# Patient Record
Sex: Female | Born: 1955 | Race: White | Hispanic: No | Marital: Single | Smoking: Current every day smoker
Health system: Southern US, Community
[De-identification: ages and names within clinical notes are randomized; demographics above are authoritative.]

## PROBLEM LIST (undated history)

## (undated) DIAGNOSIS — F419 Anxiety disorder, unspecified: Secondary | ICD-10-CM

## (undated) DIAGNOSIS — K219 Gastro-esophageal reflux disease without esophagitis: Secondary | ICD-10-CM

## (undated) DIAGNOSIS — H33309 Unspecified retinal break, unspecified eye: Secondary | ICD-10-CM

## (undated) DIAGNOSIS — M810 Age-related osteoporosis without current pathological fracture: Secondary | ICD-10-CM

## (undated) DIAGNOSIS — H269 Unspecified cataract: Secondary | ICD-10-CM

## (undated) DIAGNOSIS — M1990 Unspecified osteoarthritis, unspecified site: Secondary | ICD-10-CM

## (undated) DIAGNOSIS — G43909 Migraine, unspecified, not intractable, without status migrainosus: Secondary | ICD-10-CM

## (undated) DIAGNOSIS — N83209 Unspecified ovarian cyst, unspecified side: Secondary | ICD-10-CM

## (undated) HISTORY — PX: EYE SURGERY: SHX253

## (undated) HISTORY — DX: Unspecified cataract: H26.9

## (undated) HISTORY — DX: Migraine, unspecified, not intractable, without status migrainosus: G43.909

## (undated) HISTORY — DX: Unspecified osteoarthritis, unspecified site: M19.90

## (undated) HISTORY — DX: Gastro-esophageal reflux disease without esophagitis: K21.9

## (undated) HISTORY — DX: Unspecified ovarian cyst, unspecified side: N83.209

## (undated) HISTORY — DX: Age-related osteoporosis without current pathological fracture: M81.0

## (undated) HISTORY — DX: Unspecified retinal break, unspecified eye: H33.309

## (undated) HISTORY — DX: Anxiety disorder, unspecified: F41.9

---

## 1997-11-18 ENCOUNTER — Ambulatory Visit (HOSPITAL_COMMUNITY): Admission: RE | Admit: 1997-11-18 | Discharge: 1997-11-18 | Payer: Self-pay | Admitting: Ophthalmology

## 1998-08-05 ENCOUNTER — Other Ambulatory Visit: Admission: RE | Admit: 1998-08-05 | Discharge: 1998-08-05 | Payer: Self-pay | Admitting: Obstetrics and Gynecology

## 1998-08-25 ENCOUNTER — Other Ambulatory Visit: Admission: RE | Admit: 1998-08-25 | Discharge: 1998-08-25 | Payer: Self-pay | Admitting: Obstetrics and Gynecology

## 1999-10-03 ENCOUNTER — Other Ambulatory Visit: Admission: RE | Admit: 1999-10-03 | Discharge: 1999-10-03 | Payer: Self-pay | Admitting: Obstetrics and Gynecology

## 2000-10-19 ENCOUNTER — Other Ambulatory Visit: Admission: RE | Admit: 2000-10-19 | Discharge: 2000-10-19 | Payer: Self-pay | Admitting: Obstetrics and Gynecology

## 2005-09-26 ENCOUNTER — Other Ambulatory Visit: Admission: RE | Admit: 2005-09-26 | Discharge: 2005-09-26 | Payer: Self-pay | Admitting: Obstetrics and Gynecology

## 2007-10-10 ENCOUNTER — Encounter: Admission: RE | Admit: 2007-10-10 | Discharge: 2007-10-10 | Payer: Self-pay | Admitting: Obstetrics and Gynecology

## 2010-06-26 ENCOUNTER — Encounter: Payer: Self-pay | Admitting: Obstetrics and Gynecology

## 2013-12-19 LAB — HM MAMMOGRAPHY

## 2014-03-02 ENCOUNTER — Ambulatory Visit: Payer: 59

## 2014-06-22 ENCOUNTER — Ambulatory Visit
Admission: RE | Admit: 2014-06-22 | Discharge: 2014-06-22 | Disposition: A | Discharge status: Home / Self Care | Payer: 59 | Source: Ambulatory Visit | Attending: Emergency Medicine | Admitting: Emergency Medicine

## 2014-06-22 ENCOUNTER — Ambulatory Visit (INDEPENDENT_AMBULATORY_CARE_PROVIDER_SITE_OTHER): Payer: 59 | Admitting: Emergency Medicine

## 2014-06-22 ENCOUNTER — Ambulatory Visit (INDEPENDENT_AMBULATORY_CARE_PROVIDER_SITE_OTHER): Payer: 59

## 2014-06-22 VITALS — BP 106/66 | HR 70 | Temp 98.4°F | Resp 18 | Ht <= 58 in | Wt 97.0 lb

## 2014-06-22 DIAGNOSIS — S0093XA Contusion of unspecified part of head, initial encounter: Secondary | ICD-10-CM

## 2014-06-22 DIAGNOSIS — M25511 Pain in right shoulder: Secondary | ICD-10-CM

## 2014-06-22 NOTE — Patient Instructions (Signed)
Please report to [place] today at 2:10pm for your CT Scan The address is [place]

## 2014-06-22 NOTE — Progress Notes (Addendum)
   Subjective:    Patient ID: Christina Hinton, female    DOB: [birth year], 59 y.o.   MRN: [id number] This chart was scribed for Arlyss Queen, MD by Marti Sleigh, Medical Scribe. This patient was seen in Room 13 and the patient's care was started at 11:43 AM.  Chief Complaint  Patient presents with  . Fall    last night   . Shoulder Pain    rt  . Arm Pain    rt  . Headache    HPI HPI Comments: Christina Hinton is a 59 y.o. female who presents to [facility] complaining of right sided shoulder and arm pain, as well as HA after a fall that happened yesterday at 4pm. Pt states she got cought up in the vacuum cord, and fell backwards and hit her shoulder, arm and head on the door and stairs. Pt states she was backing up the stairs carrying her vacuum. Pt states it was several minutes before she could get up. Pt is unsure if she passed out. Pt endorses associated improving swelling on the back of her head. Pt denies nausea, vomiting. Pt states she hurts all over. Pt states he drove herself to [facility].    Review of Systems  Constitutional: Negative for fever and chills.  Gastrointestinal: Negative for nausea and vomiting.  Musculoskeletal: Positive for myalgias and back pain.  Skin: Positive for color change. Negative for wound.  Neurological: Positive for headaches. Negative for dizziness, syncope and weakness.       Objective:   Physical Exam  Constitutional: She is oriented to person, place, and time. She appears well-developed and well-nourished.  Alert, cooperative, nontoxic.  HENT:  Head: Normocephalic and atraumatic.  Significant tenderness over the occipital area.  Eyes: Pupils are equal, round, and reactive to light.  Neck: Neck supple.  Cardiovascular: Normal rate and regular rhythm.   Pulmonary/Chest: Effort normal and breath sounds normal. No respiratory distress.  Musculoskeletal:  Patient has crepitation with internal/external rotation of the right shoulder. There is no definite  weakness elicited.  Neurological: She is alert and oriented to person, place, and time.  Cranial nerves 2-12 intact. Motor strength 5/5. Reflexes 2+ and symmetrical.   Skin: Skin is warm and dry.  Psychiatric: She has a normal mood and affect. Her behavior is normal.  Nursing note and vitals reviewed. [facility] reading (PRIMARY) by  Dr.Daub No fracture      Assessment & Plan:    I suspect the shoulder injury is a contusion. Will proceed with CT head because of the contusion to the occipital area. She will be given a sling for shoulder comfort, and she can take advil or aleve for the sholdler discomfort.I personally performed the services described in this documentation, which was scribed in my presence. The recorded information has been reviewed and is accurate. CT of head was call report and negative

## 2014-06-25 ENCOUNTER — Telehealth: Payer: Self-pay

## 2014-06-25 NOTE — Telephone Encounter (Signed)
Patient came in and saw Dr. Everlene Farrier on 06/22/2014 with head and shoulder pain from a fall.   Patient was wanting Dr. Everlene Farrier to call in some pain medication, since the pain is not getting better.   She has been taking over the counter pain reliever for the pain.  Pt # [id number]

## 2014-06-26 NOTE — Telephone Encounter (Signed)
OK to call in ultram # 20 every 6 hours.

## 2014-06-28 MED ORDER — TRAMADOL HCL 50 MG PO TABS: 50.0000 mg | ORAL_TABLET | Freq: Four times a day (QID) | ORAL | Status: DC | PRN

## 2014-06-28 NOTE — Telephone Encounter (Signed)
Called in script. LM to advise pt.

## 2014-08-10 ENCOUNTER — Ambulatory Visit (INDEPENDENT_AMBULATORY_CARE_PROVIDER_SITE_OTHER): Payer: 59 | Admitting: Family Medicine

## 2014-08-10 VITALS — BP 110/74 | HR 72 | Temp 98.0°F | Resp 16 | Ht <= 58 in | Wt 97.8 lb

## 2014-08-10 DIAGNOSIS — H269 Unspecified cataract: Secondary | ICD-10-CM | POA: Diagnosis not present

## 2014-08-10 DIAGNOSIS — G43109 Migraine with aura, not intractable, without status migrainosus: Secondary | ICD-10-CM | POA: Diagnosis not present

## 2014-08-10 DIAGNOSIS — F411 Generalized anxiety disorder: Secondary | ICD-10-CM

## 2014-08-10 MED ORDER — BUTALBITAL-ASPIRIN-CAFFEINE 50-325-40 MG PO CAPS: 1.0000 | ORAL_CAPSULE | Freq: Four times a day (QID) | ORAL | Status: DC | PRN

## 2014-08-10 MED ORDER — ALPRAZOLAM 0.25 MG PO TABS: 0.2500 mg | ORAL_TABLET | Freq: Every day | ORAL | Status: DC | PRN

## 2014-08-10 NOTE — Progress Notes (Signed)
Chief Complaint:  Chief Complaint  Patient presents with  . Headache    6 weeks- the pain goes down the back of her neck  . Hip Pain    right hip- 3 years  . Head Injury    in January- She had a CT scan done- she wants a recheck on the head    HPI: Christina Hinton is a 59 y.o. female who is here for establishment of care with a new PCP. PCP is actually Dr. Everlene Farrier. However since she has not been in the office for 3 years she needs to establish care with a new PCP and Dr. Everlene Farrier is not accepting new patients. 1. She had a head injury with a negative CT scan in January. Continues to have headaches but they are better. They were more consistent with her migraine headaches. She was prescribed tramadol by Dr. Everlene Farrier for headache pain. She states that she did not even fill the medication since she does not want take tramadol and have seizures. She knew of a patient who had seizures and was taking tramadol. She has no new symptoms associated with her migraine headaches. She has some light and noise sensitivity. 2. She has had difficulty sleeping. She usually takes Xanax which also helps her with her sleep. Primarily for her anxiety. She usually takes it only once and at most twice a day to help with her anxiety and sometimes insomnia. 3. She has osteoporosis. She has arthritis. 4. She has a history of cataracts with retinal inflammation on for ketorolac drops. She has not been sent ophthalmologist in denies any major vision changes, black spots or blindness. The eye pain.  She is primarily here to establish care. She has no new problems. She has a scheduled annual visit soon and was told that she needs to establish care in order to have a scheduled annual in 104.  Past Medical History  Diagnosis Date  . Anxiety   . Arthritis   . Cataract   . Osteoporosis    Past Surgical History  Procedure Laterality Date  . Cesarean section    . Eye surgery     History   Social History  . Marital  Status: Single    Spouse Name: N/A  . Number of Children: N/A  . Years of Education: N/A   Social History Main Topics  . Smoking status: Current Every Day Smoker -- 1.00 packs/day for 30 years    Types: Cigarettes  . Smokeless tobacco: Not on file  . Alcohol Use: No  . Drug Use: No  . Sexual Activity: Not on file   Other Topics Concern  . None   Social History Narrative   Family History  Problem Relation Age of Onset  . Hyperlipidemia Mother   . Stroke Maternal Grandmother   . Cancer Maternal Grandfather   . Diabetes Paternal Grandmother    No Known Allergies Prior to Admission medications   Medication Sig Start Date End Date Taking? Authorizing Provider  ketorolac (ACULAR) 0.5 % ophthalmic solution Place 1 drop into the right eye 2 (two) times daily.   Yes Historical Provider, MD  Multiple Vitamin (MULTIVITAMIN) capsule Take 1 capsule by mouth daily.   Yes Historical Provider, MD  PARoxetine (PAXIL) 10 MG tablet Take 10 mg by mouth daily.   Yes Historical Provider, MD  ranitidine (ZANTAC) 150 MG capsule Take 150 mg by mouth 2 (two) times daily.   Yes Historical Provider, MD  traMADol Veatrice Bourbon)  50 MG tablet Take 1 tablet (50 mg total) by mouth every 6 (six) hours as needed. Patient not taking: Reported on 08/10/2014 06/28/14   [name], MD     ROS: The patient denies fevers, chills, night sweats, unintentional weight loss, chest pain, palpitations, wheezing, dyspnea on exertion, nausea, vomiting, abdominal pain, dysuria, hematuria, melena, numbness, weakness, or tingling.   All other systems have been reviewed and were otherwise negative with the exception of those mentioned in the HPI and as above.    PHYSICAL EXAM: Filed Vitals:   08/10/14 1203  BP: 110/74  Pulse: 72  Temp: 98 F (36.7 C)  Resp: 16   Filed Vitals:   08/10/14 1203  Height: 4\' 10"  (1.473 m)  Weight: 97 lb 12.8 oz (44.362 kg)   Body mass index is 20.45 kg/(m^2).  General: Alert, no acute  distress HEENT:  Normocephalic, atraumatic, oropharynx patent. EOMI, PERRLA, TMs bilaterally normal. No exudates. fundo grossly nl, no light sensitivity Cardiovascular:  Regular rate and rhythm, no rubs murmurs or gallops.  No Carotid bruits, radial pulse intact. No pedal edema.  Respiratory: Clear to auscultation bilaterally.  No wheezes, rales, or rhonchi.  No cyanosis, no use of accessory musculature GI: No organomegaly, abdomen is soft and non-tender, positive bowel sounds.  No masses. Skin: No rashes. Neurologic: Facial musculature symmetric. Psychiatric: Patient is appropriate throughout our interaction. Lymphatic: No cervical lymphadenopathy Musculoskeletal: Gait intact. 5 out of 5 strength.   LABS: No results found for this or any previous visit.   EKG/XRAY:   Primary read interpreted by Dr. Marin Comment at [facility].   ASSESSMENT/PLAN: Encounter Diagnoses  Name Primary?  . Migraine with aura and without status migrainosus, not intractable Yes  . Generalized anxiety disorder   . Cataract    She was prescribed Fioricet for migraine headaches to take when necessary. He talked about different causes of migraine headaches and also treatment options. She states that she has tried a lot of them and it good for me to look at her records. Worse that works for her very well. She was prescribed Xanax for her generalized anxiety disorder. She was referred to ophthalmology for history of bilateral cataracts and retinal abnormalities. She will call us with the name of the ophthalmology group she used to go to. We will make a referral based on that once she finds out a car. Follow-up for annual visit. Paper chart requested for Review.  Gross sideeffects, risk and benefits, and alternatives of medications d/w patient. Patient is aware that all medications have potential sideeffects and we are unable to predict every sideeffect or drug-drug interaction that may occur.  [place] Silveria, [place], DO 08/11/2014 3:18  PM

## 2014-08-11 ENCOUNTER — Telehealth: Payer: Self-pay

## 2014-08-11 NOTE — Telephone Encounter (Signed)
Patient is requesting to speak with Dr Marin Comment in regards to her being her PCP. Patient saw her yesterday for the first time at the walk in clinic. She is currently scheduled to see for her CPE on 08/21/14. Patients call back number is [phone]

## 2014-08-11 NOTE — Telephone Encounter (Signed)
Can you do me a favor and get her paper chart and out it in my box. I also need a print out of all her narcotics from the last one year, continue advancement of the PAs to do that for me. Thanks!

## 2014-08-11 NOTE — Telephone Encounter (Signed)
I called Christina Hinton, she wanted to clarify if Dr. Marin Comment will be her primary care physician. Please advise.

## 2014-08-14 ENCOUNTER — Ambulatory Visit: Payer: 59 | Admitting: Family Medicine

## 2014-08-20 NOTE — Telephone Encounter (Signed)
Chart is still in Dr. Gus Puma box. Stanton Kidney will take the chart and place it with the appt charts since the patient has an appt with Dr. Marin Comment tomorrow.

## 2014-08-21 ENCOUNTER — Encounter: Payer: Self-pay | Admitting: Family Medicine

## 2014-08-21 ENCOUNTER — Ambulatory Visit (INDEPENDENT_AMBULATORY_CARE_PROVIDER_SITE_OTHER): Payer: 59 | Admitting: Family Medicine

## 2014-08-21 VITALS — BP 102/63 | HR 65 | Temp 97.6°F | Resp 16 | Ht <= 58 in | Wt 98.4 lb

## 2014-08-21 DIAGNOSIS — Z0000 Encounter for general adult medical examination without abnormal findings: Secondary | ICD-10-CM

## 2014-08-21 DIAGNOSIS — Z2821 Immunization not carried out because of patient refusal: Secondary | ICD-10-CM | POA: Diagnosis not present

## 2014-08-21 DIAGNOSIS — Z1211 Encounter for screening for malignant neoplasm of colon: Secondary | ICD-10-CM | POA: Diagnosis not present

## 2014-08-21 DIAGNOSIS — Z1329 Encounter for screening for other suspected endocrine disorder: Secondary | ICD-10-CM

## 2014-08-21 DIAGNOSIS — Z130 Encounter for screening for diseases of the blood and blood-forming organs and certain disorders involving the immune mechanism: Secondary | ICD-10-CM

## 2014-08-21 DIAGNOSIS — Z13220 Encounter for screening for lipoid disorders: Secondary | ICD-10-CM

## 2014-08-21 DIAGNOSIS — H269 Unspecified cataract: Secondary | ICD-10-CM | POA: Diagnosis not present

## 2014-08-21 DIAGNOSIS — G43109 Migraine with aura, not intractable, without status migrainosus: Secondary | ICD-10-CM | POA: Insufficient documentation

## 2014-08-21 DIAGNOSIS — F411 Generalized anxiety disorder: Secondary | ICD-10-CM | POA: Diagnosis not present

## 2014-08-21 DIAGNOSIS — G4489 Other headache syndrome: Secondary | ICD-10-CM

## 2014-08-21 DIAGNOSIS — Z23 Encounter for immunization: Secondary | ICD-10-CM | POA: Diagnosis not present

## 2014-08-21 DIAGNOSIS — Z9889 Other specified postprocedural states: Secondary | ICD-10-CM | POA: Diagnosis not present

## 2014-08-21 LAB — COMPLETE METABOLIC PANEL WITH GFR
AST: 25 U/L (ref 0–37)
Albumin: 4.2 g/dL (ref 3.5–5.2)
Alkaline Phosphatase: 93 U/L (ref 39–117)
BUN: 17 mg/dL (ref 6–23)
CO2: 25 mEq/L (ref 19–32)
Calcium: 10.1 mg/dL (ref 8.4–10.5)
Chloride: 103 mEq/L (ref 96–112)
Creat: 1.04 mg/dL (ref 0.50–1.10)
GFR, Est Non African American: 59 mL/min — ABNORMAL LOW
Glucose, Bld: 85 mg/dL (ref 70–99)
Potassium: 4.3 mEq/L (ref 3.5–5.3)
Sodium: 140 mEq/L (ref 135–145)
Total Protein: 6.5 g/dL (ref 6.0–8.3)

## 2014-08-21 LAB — CBC
HCT: 41.3 % (ref 36.0–46.0)
Hemoglobin: 14.2 g/dL (ref 12.0–15.0)
MCH: 32.6 pg (ref 26.0–34.0)
MCHC: 34.4 g/dL (ref 30.0–36.0)
MCV: 94.7 fL (ref 78.0–100.0)
MPV: 11.1 fL (ref 8.6–12.4)
Platelets: 262 10*3/uL (ref 150–400)
RBC: 4.36 MIL/uL (ref 3.87–5.11)
RDW: 13.3 % (ref 11.5–15.5)
WBC: 9 10*3/uL (ref 4.0–10.5)

## 2014-08-21 LAB — COMPLETE METABOLIC PANEL WITHOUT GFR
ALT: 16 U/L (ref 0–35)
GFR, Est African American: 68 mL/min
Total Bilirubin: 0.3 mg/dL (ref 0.2–1.2)

## 2014-08-21 LAB — LIPID PANEL
Cholesterol: 193 mg/dL (ref 0–200)
HDL: 98 mg/dL (ref >=46)
LDL Cholesterol: 81 mg/dL (ref 0–99)
Total CHOL/HDL Ratio: 2 Ratio
Triglycerides: 71 mg/dL (ref <150)
VLDL: 14 mg/dL (ref 0–40)

## 2014-08-21 LAB — TSH: TSH: 0.907 u[IU]/mL (ref 0.350–4.500)

## 2014-08-21 MED ORDER — ALPRAZOLAM 0.25 MG PO TABS: 0.2500 mg | ORAL_TABLET | Freq: Every day | ORAL | Status: DC | PRN

## 2014-08-21 MED ORDER — PAROXETINE HCL 10 MG PO TABS: 10.0000 mg | ORAL_TABLET | Freq: Every day | ORAL | Status: DC

## 2014-08-21 MED ORDER — BUTALBITAL-ASA-CAFF-CODEINE 50-325-40-30 MG PO CAPS: 1.0000 | ORAL_CAPSULE | Freq: Two times a day (BID) | ORAL | Status: DC | PRN

## 2014-08-21 NOTE — Progress Notes (Signed)
Chief Complaint:  Chief Complaint  Patient presents with  . Annual Exam    HPI: Christina Hinton is a 59 y.o. female who is here for: 1. Dr Radene Knee ob/gyn, she sees him for pap, mammo and also bone density, last seen in June 2015 No prior abnormal pap, no malignancy mammogram. She would like to go back to him to get all of this done since they do it in one visit if she indicates that she wants it all done at that time. She does not like going to different places to get her Pap and mammogram and bone density test. 2. Eye exam -not UTD, Dr Katy Fitch called her to confirm appt, so she is scheduled for one, Dr Gershon Crane did her surgery and Deloria Lair is her retinal specialist 3. Colonscopy 2009, good for 10 years 4. Not UTD on vaccines, needs tDap, decline flu , does not need Pneumonia vaccine at this time she would not like one if she does not need to get at this time. 5. Her last visit with me she was given Fioricet without codeine and she called back and stated that it was the wrong prescription but she felt that anyways for her headache. She was dispensed 20 pills and would like to have the Fioricet with codeine instead for her migraine headaches. In the past she has had a dependency issues with these medications and it was noted in her chart that she should only have Fioricet with codeine 2 tablets at most daily. She was also given anxiety medication to help with sleep. It helps with her sleep and anxiety. She would like refills on those as well.    Last OV: 1. She had a head injury with a negative CT scan in January. Continues to have headaches but they are better. They were more consistent with her migraine headaches. She was prescribed tramadol by Dr. Everlene Farrier for headache pain. She states that she did not even fill the medication since she does not want take tramadol and have seizures. She knew of a patient who had seizures and was taking tramadol. She has no new symptoms associated with her  migraine headaches. She has some light and noise sensitivity. 2. She has had difficulty sleeping. She usually takes Xanax which also helps her with her sleep. Primarily for her anxiety. She usually takes it only once and at most twice a day to help with her anxiety and sometimes insomnia. 3. She has osteoporosis. She has arthritis. 4. She has a history of cataracts with retinal inflammation on for ketorolac drops. She has not been sent ophthalmologist in denies any major vision changes, black spots or blindness. The eye pain.  Past Medical History  Diagnosis Date  . Anxiety   . Arthritis   . Cataract   . Osteoporosis   . Retinal defect     swelling, Dr Zadie Rhine   . 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 Ovarian cyst     right , Dr Radene Knee is her ob.gyn ( she gets pap, mammo, pelvic and bone density scan at [facility])  . GERD (gastroesophageal reflux disease)     Dr Amedeo Plenty is her GI specialist   Past Surgical History  Procedure Laterality Date  . Cesarean section    . Eye surgery      right side x  2, Dr Gershon Crane   History   Social History  . Marital Status: Single    Spouse Name: N/A  . Number of Children: N/A  . Years of Education: N/A   Social History Main Topics  . Smoking status: Current Every Day Smoker -- 1.00 packs/day for 30 years    Types: Cigarettes  . Smokeless tobacco: Not on file  . Alcohol Use: No  . Drug Use: No  . Sexual Activity: Yes   Other Topics Concern  . None   Social History Narrative   Family History  Problem Relation Age of Onset  . Hyperlipidemia Mother   . Stroke Maternal Grandmother   . Cancer Maternal Grandfather   . Diabetes Paternal Grandmother    No Known Allergies Prior to Admission medications   Medication Sig Start Date End Date Taking? Authorizing Provider  ALPRAZolam (XANAX) 0.25 MG tablet Take 1 tablet (0.25 mg total) by mouth daily  as needed for anxiety. 08/10/14  Yes Charles Andringa P Mersadez Linden, DO  butalbital-aspirin-caffeine (FIORINAL) 50-325-40 MG per capsule Take 1 capsule by mouth every 6 (six) hours as needed for headache. 08/10/14  Yes Rosemond Lyttle P Dillard Pascal, DO  ketorolac (ACULAR) 0.5 % ophthalmic solution Place 1 drop into the right eye 2 (two) times daily.   Yes Historical Provider, MD  Multiple Vitamin (MULTIVITAMIN) capsule Take 1 capsule by mouth daily.   Yes Historical Provider, MD  PARoxetine (PAXIL) 10 MG tablet Take 10 mg by mouth daily.   Yes Historical Provider, MD  ranitidine (ZANTAC) 150 MG capsule Take 150 mg by mouth 2 (two) times daily.   Yes Historical Provider, MD     ROS: The patient denies fevers, chills, night sweats, unintentional weight loss, chest pain, palpitations, wheezing, dyspnea on exertion, nausea, vomiting, abdominal pain, dysuria, hematuria, melena, numbness, weakness, or tingling.   All other systems have been reviewed and were otherwise negative with the exception of those mentioned in the HPI and as above.    PHYSICAL EXAM: Filed Vitals:   08/21/14 0824  BP: 102/63  Pulse: 65  Temp: 97.6 F (36.4 C)  Resp: 16   Filed Vitals:   08/21/14 0824  Height: 4' 9.5" (1.461 m)  Weight: 98 lb 6.4 oz (44.634 kg)   Body mass index is 20.91 kg/(m^2).  General: Alert, no acute distress HEENT:  Normocephalic, atraumatic, oropharynx patent. EOMI, PERRLA, funduscopic exam within normal limits grossly. Cardiovascular:  Regular rate and rhythm, no rubs murmurs or gallops.  No Carotid bruits, radial pulse intact. No pedal edema.  Respiratory: Clear to auscultation bilaterally.  No wheezes, rales, or rhonchi.  No cyanosis, no use of accessory musculature GI: No organomegaly, abdomen is soft and non-tender, positive bowel sounds.  No masses. Skin: No rashes. Neurologic: Facial musculature symmetric. Psychiatric: Patient is appropriate throughout our interaction. Lymphatic: No cervical lymphadenopathy Musculoskeletal:  Gait intact. 5 out of 5 strength, 2 out of 2 DTRs. Sensation intact.   LABS: Results for orders placed or performed in visit on 08/21/14  COMPLETE METABOLIC PANEL WITH GFR  Result Value Ref Range   Sodium  135 - 145 mEq/L   Potassium  3.5 - 5.3 mEq/L   Chloride  96 - 112 mEq/L   CO2  19 - 32 mEq/L   Glucose, Bld  70 - 99 mg/dL   BUN  6 - 23 mg/dL   Creat  0.50 - 1.35 mg/dL   Total Bilirubin  0.2 - 1.2 mg/dL   Alkaline Phosphatase  39 - 117  U/L   AST  0 - 37 U/L   ALT  0 - 53 U/L   Total Protein  6.0 - 8.3 g/dL   Albumin  3.5 - 5.2 g/dL   Calcium  8.4 - 10.5 mg/dL   GFR, Est African American  mL/min   GFR, Est Non African American  mL/min  CBC  Result Value Ref Range   WBC 9.0 4.0 - 10.5 K/uL   RBC 4.36 3.87 - 5.11 MIL/uL   Hemoglobin 14.2 12.0 - 15.0 g/dL   HCT 41.3 36.0 - 46.0 %   MCV 94.7 78.0 - 100.0 fL   MCH 32.6 26.0 - 34.0 pg   MCHC 34.4 30.0 - 36.0 g/dL   RDW 13.3 11.5 - 15.5 %   Platelets 262 150 - 400 K/uL   MPV 11.1 8.6 - 12.4 fL  TSH  Result Value Ref Range   TSH  0.350 - 4.500 uIU/mL  Lipid panel  Result Value Ref Range   Cholesterol  0 - 200 mg/dL   Triglycerides  <150 mg/dL   HDL  >=40 mg/dL   Total CHOL/HDL Ratio  Ratio   VLDL  0 - 40 mg/dL   LDL Cholesterol  0 - 99 mg/dL     EKG/XRAY:   Primary read interpreted by Dr. Marin Comment at [facility].   ASSESSMENT/PLAN: Encounter Diagnoses  Name Primary?  . Annual physical exam Yes  . Screening for deficiency anemia   . Screening for hyperlipidemia   . Screening for thyroid disorder   . Special screening for malignant neoplasms, colon   . Influenza vaccination declined   . 23-polyvalent pneumococcal polysaccharide vaccine declined   . Need for prophylactic vaccination with combined diphtheria-tetanus-pertussis (DTP) vaccine   . Cataract   . Other headache syndrome   . Migraine with aura and without status migrainosus, not intractable   . Generalized anxiety disorder    Christina Hinton is a pleasant  59 year old female who is here for a annual physical. She declines influenza vaccine and also pneumonia vaccine. She does not have any chronic medical issues that are immunosuppressive that would required her to have an earlier pneumonia vaccine so I will not push it. Tetanus was given today. Refill on her Fioricet with codeine and also alprazolam was given, precautions was given for both medicines. Refill on her Paxil was also given. Basic labs are pending. Breast and GU exam were deferred since she gets them with Dr. Radene K[facility] at [facility]. We'll get her Pap, pelvic, mammogram, bone density with Dr. Radene Knee. She was given a hemosure to drop off when able for colon cancer screening, she is up-to-date on colonoscopy but that was in 2009. She was referred to Dr. Gershon Crane for a recheck on her cataracts and retinal issues. Follow-up in 6 months.  Gross sideeffects, risk and benefits, and alternatives of medications d/w patient. Patient is aware that all medications have potential sideeffects and we are unable to predict every sideeffect or drug-drug interaction that may occur.  Christina Hinton, [place], DO 08/21/2014 1:52 PM

## 2014-08-23 ENCOUNTER — Encounter: Payer: Self-pay | Admitting: Family Medicine

## 2014-10-14 ENCOUNTER — Telehealth: Payer: Self-pay

## 2014-10-14 NOTE — Telephone Encounter (Signed)
Pt is needing to talk with someone about getting a separate rx of florinal with codeine -she is going out of town and has a refill on 10/18/14 but it will take her refills with her   Best number [phone]

## 2014-10-15 NOTE — Telephone Encounter (Signed)
She would like Dr. Marin Comment to write another Rx for a one time fill for her medication.. She states the pharmacist suggested the refills on her original Rx would be cancelled if she were to fill it so she just needs a separate one to take to [facility] with her. She knows she cannot fill it until the 15th she just doesn't want to lose her refills.

## 2014-10-19 ENCOUNTER — Other Ambulatory Visit: Payer: Self-pay | Admitting: Family Medicine

## 2014-10-19 DIAGNOSIS — G43109 Migraine with aura, not intractable, without status migrainosus: Secondary | ICD-10-CM

## 2014-10-19 DIAGNOSIS — F411 Generalized anxiety disorder: Secondary | ICD-10-CM

## 2014-10-19 MED ORDER — ALPRAZOLAM 0.25 MG PO TABS: 0.2500 mg | ORAL_TABLET | Freq: Every day | ORAL | Status: DC | PRN

## 2014-10-19 NOTE — Telephone Encounter (Signed)
Called pt to see if she

## 2014-10-21 NOTE — Telephone Encounter (Signed)
rx called in and pt notified.

## 2014-10-29 ENCOUNTER — Telehealth: Payer: Self-pay | Admitting: *Deleted

## 2014-10-29 NOTE — Telephone Encounter (Signed)
Phoned Dr. Sherran Needs office and spoke with Jinny Blossom and she is faxing mammo and dexa scan from summer 2015

## 2014-10-29 NOTE — Telephone Encounter (Signed)
Received fax from [facility] [place] for dexa scan, mammogram and pap smear.  Health maintenance updated for mammo & sent for scanning (abstracted mammo results only).

## 2014-11-18 ENCOUNTER — Other Ambulatory Visit: Payer: Self-pay | Admitting: Family Medicine

## 2014-11-18 DIAGNOSIS — G4489 Other headache syndrome: Secondary | ICD-10-CM

## 2014-11-18 MED ORDER — BUTALBITAL-ASA-CAFF-CODEINE 50-325-40-30 MG PO CAPS: 1.0000 | ORAL_CAPSULE | Freq: Two times a day (BID) | ORAL | Status: DC | PRN

## 2014-11-18 NOTE — Progress Notes (Signed)
Patient presents at the front desk requesting a refill of her Fioricet with codeine. She presents a bottle where she had a fill one month ago in [facility]. The bottle has one refill, but the patient reports the prescription can not be filled in [place]. She is requesting one month supply and will see Dr. Marin Comment in follow up before further refills are given.

## 2015-01-09 ENCOUNTER — Other Ambulatory Visit: Payer: Self-pay | Admitting: Family Medicine

## 2015-01-12 NOTE — Telephone Encounter (Signed)
Called in.

## 2015-01-29 ENCOUNTER — Telehealth: Payer: Self-pay

## 2015-01-29 DIAGNOSIS — F411 Generalized anxiety disorder: Secondary | ICD-10-CM

## 2015-01-29 DIAGNOSIS — G43109 Migraine with aura, not intractable, without status migrainosus: Secondary | ICD-10-CM

## 2015-01-29 NOTE — Telephone Encounter (Signed)
Patient is calling to request a refill for xanax and wants it faxed to [facility] on [address].

## 2015-01-30 MED ORDER — ALPRAZOLAM 0.25 MG PO TABS: 0.2500 mg | ORAL_TABLET | Freq: Every day | ORAL | Status: DC | PRN

## 2015-01-30 NOTE — Telephone Encounter (Signed)
Done called into [facility]

## 2015-02-11 ENCOUNTER — Other Ambulatory Visit: Payer: Self-pay | Admitting: Physician Assistant

## 2015-02-14 NOTE — Telephone Encounter (Signed)
PATIENT STATES HER PHARMACY FAXED OVER A PRESCRIPTION REFILL ON THURS. SHE HAS NOT HEARD BACK FROM [place]. SHE WOULD LIKE TO GET A CALL BACK AS SOON AS POSSIBLE.. THIS IS FOR HER PAIN MEDICINE WITH CODEINE. BEST PHONE [phone]  PHARMACY CHOICE IS [facility] ON [address].  [place]

## 2015-02-16 ENCOUNTER — Telehealth: Payer: Self-pay | Admitting: Family Medicine

## 2015-02-16 NOTE — Telephone Encounter (Signed)
Patient is calling because she's been waiting on her medication since Thursday and is wondering if it's best to just come in.

## 2015-02-16 NOTE — Telephone Encounter (Signed)
Called patient and notified rx ASCOMP-CODEINE 50-325-40-30 MG capsule faxed to [place] [address]. Received confirmation

## 2015-03-09 ENCOUNTER — Encounter: Payer: Self-pay | Admitting: Emergency Medicine

## 2015-03-15 ENCOUNTER — Other Ambulatory Visit: Payer: Self-pay | Admitting: Family Medicine

## 2015-03-16 ENCOUNTER — Other Ambulatory Visit: Payer: Self-pay | Admitting: Family Medicine

## 2015-03-19 NOTE — Telephone Encounter (Signed)
Patient is calling to check on the status of her medication request. She would like to if she needs by Dr. Marin Comment to be seen so that she can come today.

## 2015-03-20 ENCOUNTER — Telehealth: Payer: Self-pay

## 2015-03-20 NOTE — Telephone Encounter (Signed)
Pt states that the pharmacy , still has not received the rx for her ASCOMP-CODEINE 50-325-40-30 MG capsule. It looks like it was printed on 03/15/15, however the phamracy doesn't have it.   Best#  [id number]

## 2015-03-20 NOTE — Telephone Encounter (Signed)
Spoke with patient. [employer]  We received request on 03/15/2015, and the Rx was printed. Another request received on 03/16/2015, still pending. The pharmacy called the patient to let her know they had not yet heard back from [place].  I left a message for the pharmacy, authorizing the Ascomp-Codeine, 1 PO BID, #60, no refills.

## 2015-03-22 NOTE — Telephone Encounter (Signed)
Did patient get prescription for codeine

## 2015-04-16 ENCOUNTER — Other Ambulatory Visit: Payer: Self-pay | Admitting: Physician Assistant

## 2015-04-20 NOTE — Telephone Encounter (Signed)
rx faxed to pharmacy

## 2015-04-26 ENCOUNTER — Other Ambulatory Visit: Payer: Self-pay | Admitting: Family Medicine

## 2015-05-18 ENCOUNTER — Ambulatory Visit (INDEPENDENT_AMBULATORY_CARE_PROVIDER_SITE_OTHER): Payer: 59 | Admitting: Family Medicine

## 2015-05-18 VITALS — BP 112/74 | HR 72 | Temp 98.4°F | Resp 18 | Ht <= 58 in | Wt 97.0 lb

## 2015-05-18 DIAGNOSIS — Z23 Encounter for immunization: Secondary | ICD-10-CM

## 2015-05-18 DIAGNOSIS — N949 Unspecified condition associated with female genital organs and menstrual cycle: Secondary | ICD-10-CM | POA: Diagnosis not present

## 2015-05-18 DIAGNOSIS — F411 Generalized anxiety disorder: Secondary | ICD-10-CM | POA: Diagnosis not present

## 2015-05-18 DIAGNOSIS — G43109 Migraine with aura, not intractable, without status migrainosus: Secondary | ICD-10-CM

## 2015-05-18 DIAGNOSIS — G4489 Other headache syndrome: Secondary | ICD-10-CM

## 2015-05-18 LAB — POCT WET + KOH PREP
Trich by wet prep: ABSENT
Yeast by KOH: ABSENT
Yeast by wet prep: ABSENT

## 2015-05-18 LAB — POCT URINALYSIS DIP (MANUAL ENTRY)
Bilirubin, UA: NEGATIVE
Glucose, UA: NEGATIVE
Ketones, POC UA: NEGATIVE
Leukocytes, UA: NEGATIVE
Nitrite, UA: NEGATIVE
Protein Ur, POC: NEGATIVE
Spec Grav, UA: 1.02
Urobilinogen, UA: 0.2
pH, UA: 7

## 2015-05-18 LAB — POC MICROSCOPIC URINALYSIS (UMFC): Mucus: ABSENT

## 2015-05-18 MED ORDER — PAROXETINE HCL 10 MG PO TABS: 10.0000 mg | ORAL_TABLET | Freq: Every day | ORAL | Status: DC

## 2015-05-18 MED ORDER — BUTALBITAL-APAP-CAFF-COD 50-300-40-30 MG PO CAPS: 1.0000 | ORAL_CAPSULE | Freq: Two times a day (BID) | ORAL | Status: DC | PRN

## 2015-05-18 MED ORDER — ALPRAZOLAM 0.25 MG PO TABS: ORAL_TABLET | ORAL | Status: DC

## 2015-05-18 NOTE — Progress Notes (Signed)
 Chief Complaint:  Chief Complaint  Patient presents with  . Follow-up    medications would like to change asa/cod to tylenol/cod  . Flu Vaccine  . Vaginal Pain    off and on x2 weeks     HPI: Christina Hinton is a 59 y.o. female who reports to [facility] today complaining of: 1. 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  She has had recurrence in last 5 weeks or so, she had it after childbirth for about several months. Dr Radene Knee, last saw him in July and pap and pelvic were normal. She denies any vaginal dryness, it happens pontaneously and does not  She thinks she had a laparopscopic exam about 25 yearsa go. She needs to see him again.  2. She wants the flu vaccine 3. Medication would like to change her Ascomp (bulbatitalol + asa to tylenol with codeine) since takes excedrin regular and odes not want too much ASA in it. She denies any GIB and doe snot want one. Does have a hx of GERD 4. Anxiety meds help, tried getting off paxil but she thinks it may help her, xanax helps her. She denies any abuse or divergence. Deneis SIHi Hallucinations   Recently has been out of work for several years and now has a part time back at [employer] . She is going back to work and is feeling good about this.  She is UTD On her annual exam things from 2015. Dr Sherran Needs office has done this.     Past Medical History  Diagnosis Date  . Anxiety   . Arthritis   . Cataract   . Osteoporosis   . Retinal defect     swelling, Dr Zadie Rhine   . 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 Ovarian cyst     right , Dr Radene Knee is her ob.gyn ( she gets pap, mammo, pelvic and bone density scan at [facility])  . GERD (gastroesophageal reflux disease)     Dr Amedeo Plenty is her GI specialist   Past  Surgical History  Procedure Laterality Date  . Cesarean section    . Eye surgery      right side x 2, Dr Gershon Crane   Social History   Social History  . Marital Status: Single    Spouse Name: N/A  . Number of Children: N/A  . Years of Education: N/A   Social History Main Topics  . Smoking status: Current Every Day Smoker -- 1.00 packs/day for 30 years    Types: Cigarettes  . Smokeless tobacco: None  . Alcohol Use: No  . Drug Use: No  . Sexual Activity: Yes   Other Topics Concern  . None   Social History Narrative   Family History  Problem Relation Age of Onset  . Hyperlipidemia Mother   . Stroke Maternal Grandmother   . Cancer Maternal Grandfather   . Diabetes Paternal Grandmother    No Known Allergies Prior to Admission medications   Medication Sig Start Date End Date Taking? Authorizing Provider  ALPRAZolam Duanne Moron) 0.25 MG tablet TAKE ONE TABLET BY MOUTH ONCE DAILY AS NEEDED 04/27/15  Yes  P , DO  ASCOMP-CODEINE  50-325-40-30 MG capsule TAKE ONE CAPSULE BY MOUTH TWICE DAILY AS NEEDED FOR PAIN 04/20/15  Yes  P , DO  Multiple Vitamin (MULTIVITAMIN) capsule Take 1 capsule by mouth daily.   Yes Historical Provider, MD  PARoxetine (PAXIL) 10 MG tablet Take 1 tablet (10 mg total) by mouth daily. 08/21/14  Yes  P , DO  ranitidine (ZANTAC) 150 MG capsule Take 150 mg by mouth 2 (two) times daily.   Yes Historical Provider, MD  ketorolac (ACULAR) 0.5 % ophthalmic solution Place 1 drop into the right eye 2 (two) times daily.    Historical Provider, MD     ROS: The patient denies fevers, chills, night sweats, unintentional weight loss, chest pain, palpitations, wheezing, dyspnea on exertion, nausea, vomiting, abdominal pain, dysuria, hematuria, melena, numbness, weakness, or tingling.  All other systems have been reviewed and were otherwise negative with the exception of those mentioned in the HPI and as above.    PHYSICAL EXAM: Filed Vitals:   05/18/15 1607    BP: 112/74  Pulse: 72  Temp: 98.4 F (36.9 C)  Resp: 18   Body mass index is 20.61 kg/(m^2).   General: Alert, no acute distress HEENT:  Normocephalic, atraumatic, oropharynx patent. EOMI, PERRLA Normal TM  Cardiovascular:  Regular rate and rhythm, no rubs murmurs or gallops.  No Carotid bruits, radial pulse intact. No pedal edema.  Respiratory: Clear to auscultation bilaterally.  No wheezes, rales, or rhonchi.  No cyanosis, no use of accessory musculature Abdominal: No organomegaly, abdomen is soft and non-tender, positive bowel sounds. No masses. Skin: No rashes. Neurologic: Facial musculature symmetric. Psychiatric: Patient acts appropriately throughout our interaction. Lymphatic: No cervical or submandibular lymphadenopathy Musculoskeletal: Gait intact. No edema, tenderness   LABS: Results for orders placed or performed in visit on 05/18/15  POCT urinalysis dipstick  Result Value Ref Range   Color, UA yellow yellow   Clarity, UA clear clear   Glucose, UA negative negative   Bilirubin, UA negative negative   Ketones, POC UA negative negative   Spec Grav, UA 1.020    Blood, UA trace-intact (A) negative   pH, UA 7.0    Protein Ur, POC negative negative   Urobilinogen, UA 0.2    Nitrite, UA Negative Negative   ukocytes, UA Negative Negative  POCT Microscopic Urinalysis ([facility])  Result Value Ref Range   WBC,UR,HPF,POC Few (A) None WBC/hpf   RBC,UR,HPF,POC Few (A) None RBC/hpf   Bacteria Few (A) None, Too numerous to count   Mucus Absent Absent   Epithelial Cells, UR Per Microscopy Few (A) None, Too numerous to count cells/hpf  POCT Wet + KOH Prep  Result Value Ref Range   Yeast by KOH Absent Present, Absent   Yeast by wet prep Absent Present, Absent   WBC by wet prep Moderate (A) None, Few, Too numerous to count   Clue Cells Wet Prep HPF POC None None, Too numerous to count   Trich by wet prep Absent Present, Absent   Bacteria Wet Prep HPF POC Many (A) None, Few,  Too numerous to count   Epithelial Cells By [employer] Pref ([facility]) Many (A) None, Few, Too numerous to count   RBC,UR,HPF,POC None None RBC/hpf     EKG/XRAY:   Primary read interpreted by Dr. Marin Comment at [facility].   ASSESSMENT/PLAN: Encounter Diagnoses  Name Primary?  . Other headache syndrome Yes  . Vaginal discomfort   . Flu vaccine need   . Generalized anxiety disorder   . Migraine with  aura and without status migrainosus, not intractable    Rx fioricet with codeien , DC Ascomp  Go to Dr Radene Knee for pelvic exam and clitoral spasm, declines to have pelvic today. She has had this before Refill Paxil and also  Xanax Fu in 3-6 months  Gross sideeffects, risk and benefits, and alternatives of medications d/w patient. Patient is aware that all medications have potential sideeffects and we are unable to predict every sideeffect or drug-drug interaction that may occur.    DO  05/18/2015 6:09 PM

## 2015-05-20 ENCOUNTER — Telehealth: Payer: Self-pay

## 2015-05-20 DIAGNOSIS — G43109 Migraine with aura, not intractable, without status migrainosus: Secondary | ICD-10-CM

## 2015-05-20 NOTE — Telephone Encounter (Signed)
Pt states insurance does not cover Butalbital-APAP-Caff-Cod. She needs another alternative med  Please advise  [phone]

## 2015-05-21 NOTE — Telephone Encounter (Signed)
She can try using over the counter excedrin that is aspirin free, it will have tylenol in it. Then we can go back to the Ascomp which is paid for by her insurance. She will have aspirin only in the Ascomp and not both aspirin in the excedrin and also the Ascomp. I tried calling her but no pick on phone. Let me know if she wants the Acomp so I can call it in later tonight or tomorrow morning

## 2015-05-21 NOTE — Telephone Encounter (Signed)
Dr Marin Comment - would you like to change this medication

## 2015-05-21 NOTE — Telephone Encounter (Signed)
Can another provider handle this request?

## 2015-05-22 MED ORDER — BUTALBITAL-ASA-CAFF-CODEINE 50-325-40-30 MG PO CAPS: ORAL_CAPSULE | ORAL | Status: DC

## 2015-05-22 NOTE — Telephone Encounter (Signed)
Patient called me back will go ahead and ask her to find aspirin free excedrin and return to Ascomp, the other alternative with tylenol would cost her $400 per month for 60 pills.

## 2015-06-30 ENCOUNTER — Other Ambulatory Visit: Payer: Self-pay | Admitting: Obstetrics and Gynecology

## 2015-06-30 DIAGNOSIS — F17200 Nicotine dependence, unspecified, uncomplicated: Secondary | ICD-10-CM

## 2015-08-21 ENCOUNTER — Emergency Department (HOSPITAL_COMMUNITY): Payer: BLUE CROSS/BLUE SHIELD

## 2015-08-21 ENCOUNTER — Emergency Department (HOSPITAL_COMMUNITY)
Admission: EM | Admit: 2015-08-21 | Discharge: 2015-08-21 | Disposition: A | Discharge status: Home / Self Care | Payer: BLUE CROSS/BLUE SHIELD | Attending: Emergency Medicine | Admitting: Emergency Medicine

## 2015-08-21 ENCOUNTER — Encounter (HOSPITAL_COMMUNITY): Payer: Self-pay | Admitting: Family Medicine

## 2015-08-21 DIAGNOSIS — S4992XA Unspecified injury of left shoulder and upper arm, initial encounter: Secondary | ICD-10-CM | POA: Diagnosis present

## 2015-08-21 DIAGNOSIS — Z8679 Personal history of other diseases of the circulatory system: Secondary | ICD-10-CM | POA: Insufficient documentation

## 2015-08-21 DIAGNOSIS — K219 Gastro-esophageal reflux disease without esophagitis: Secondary | ICD-10-CM | POA: Diagnosis not present

## 2015-08-21 DIAGNOSIS — F419 Anxiety disorder, unspecified: Secondary | ICD-10-CM | POA: Insufficient documentation

## 2015-08-21 DIAGNOSIS — Y9289 Other specified places as the place of occurrence of the external cause: Secondary | ICD-10-CM | POA: Insufficient documentation

## 2015-08-21 DIAGNOSIS — M1990 Unspecified osteoarthritis, unspecified site: Secondary | ICD-10-CM | POA: Diagnosis not present

## 2015-08-21 DIAGNOSIS — Z8739 Personal history of other diseases of the musculoskeletal system and connective tissue: Secondary | ICD-10-CM | POA: Diagnosis not present

## 2015-08-21 DIAGNOSIS — W01198A Fall on same level from slipping, tripping and stumbling with subsequent striking against other object, initial encounter: Secondary | ICD-10-CM | POA: Diagnosis not present

## 2015-08-21 DIAGNOSIS — R110 Nausea: Secondary | ICD-10-CM | POA: Diagnosis not present

## 2015-08-21 DIAGNOSIS — Z79899 Other long term (current) drug therapy: Secondary | ICD-10-CM | POA: Diagnosis not present

## 2015-08-21 DIAGNOSIS — Y9389 Activity, other specified: Secondary | ICD-10-CM | POA: Insufficient documentation

## 2015-08-21 DIAGNOSIS — S0003XA Contusion of scalp, initial encounter: Secondary | ICD-10-CM | POA: Diagnosis not present

## 2015-08-21 DIAGNOSIS — Z8742 Personal history of other diseases of the female genital tract: Secondary | ICD-10-CM | POA: Diagnosis not present

## 2015-08-21 DIAGNOSIS — Z9841 Cataract extraction status, right eye: Secondary | ICD-10-CM | POA: Diagnosis not present

## 2015-08-21 DIAGNOSIS — Y998 Other external cause status: Secondary | ICD-10-CM | POA: Insufficient documentation

## 2015-08-21 DIAGNOSIS — M25512 Pain in left shoulder: Secondary | ICD-10-CM

## 2015-08-21 DIAGNOSIS — G8929 Other chronic pain: Secondary | ICD-10-CM | POA: Diagnosis not present

## 2015-08-21 DIAGNOSIS — F17210 Nicotine dependence, cigarettes, uncomplicated: Secondary | ICD-10-CM | POA: Insufficient documentation

## 2015-08-21 LAB — CBC WITH DIFFERENTIAL/PLATELET
BASOS ABS: 0 10*3/uL (ref 0.0–0.1)
Basophils Relative: 0 %
Eosinophils Absolute: 0 10*3/uL (ref 0.0–0.7)
Eosinophils Relative: 0 %
HEMATOCRIT: 40.4 % (ref 36.0–46.0)
Hemoglobin: 13 g/dL (ref 12.0–15.0)
LYMPHS ABS: 2.1 10*3/uL (ref 0.7–4.0)
LYMPHS PCT: 17 %
MCH: 32 pg (ref 26.0–34.0)
MCHC: 32.2 g/dL (ref 30.0–36.0)
MCV: 99.5 fL (ref 78.0–100.0)
MONO ABS: 0.8 10*3/uL (ref 0.1–1.0)
MONOS PCT: 7 %
NEUTROS ABS: 9.4 10*3/uL — AB (ref 1.7–7.7)
Neutrophils Relative %: 76 %
Platelets: 193 10*3/uL (ref 150–400)
RBC: 4.06 MIL/uL (ref 3.87–5.11)
RDW: 13.7 % (ref 11.5–15.5)
WBC: 12.3 10*3/uL — ABNORMAL HIGH (ref 4.0–10.5)

## 2015-08-21 LAB — BASIC METABOLIC PANEL
ANION GAP: 9 (ref 5–15)
BUN: 15 mg/dL (ref 6–20)
CALCIUM: 9.2 mg/dL (ref 8.9–10.3)
CO2: 28 mmol/L (ref 22–32)
Chloride: 104 mmol/L (ref 101–111)
Creatinine, Ser: 1.05 mg/dL — ABNORMAL HIGH (ref 0.44–1.00)
GFR calc Af Amer: >60 mL/min (ref >60)
GFR calc non Af Amer: 57 mL/min — ABNORMAL LOW (ref >60)
GLUCOSE: 101 mg/dL — AB (ref 65–99)
Potassium: 4.2 mmol/L (ref 3.5–5.1)
Sodium: 141 mmol/L (ref 135–145)

## 2015-08-21 LAB — I-STAT TROPONIN, ED: Troponin i, poc: 0 ng/mL (ref 0.00–0.08)

## 2015-08-21 MED ORDER — IBUPROFEN 800 MG PO TABS: 800.0000 mg | ORAL_TABLET | Freq: Once | ORAL | Status: DC

## 2015-08-21 NOTE — ED Notes (Signed)
PA cleared pt to eat and drink, given sprite.

## 2015-08-21 NOTE — ED Provider Notes (Signed)
CSN: [id number]     Arrival date & time 08/21/15  5732 History   First MD Initiated Contact with Patient 08/21/15 1058     Chief Complaint  Patient presents with  . Fall  . Shoulder Pain     (Consider location/radiation/quality/duration/timing/severity/associated sxs/prior Treatment) HPI   Christina Hinton is a 60 y.o. female with PMH significant for chronic neck pain, anxiety, migraine, GERD, ovarian cyst who presents with severe, constant, radiating left shoulder pain.  She reports it has been going on for the last week.  Denies injury or trauma.  She states that the pain feels like a burning, sharp pain that is worse with movement. She states that it will typically start around her deltoid to elbow and radiate to the elbow, underarm, and occasionally the neck.  No meds PTA.  She states she takes [place] for her migraines, and states this has not helped.  She reports today, she was attempting to take of her coat and she suddenly became dizzy and nauseous.  She states the next thing she remembers is "coming to" on the floor with her grandson looking over her.  She denies any CP, SOB, HA, vomiting, abdominal pain, or urinary symptoms.    Past Medical History  Diagnosis Date  . Anxiety   . Arthritis   . Cataract   . Osteoporosis   . Retinal defect     swelling, Dr Zadie Rhine   . 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 Ovarian cyst     right , Dr Radene Knee is her ob.gyn ( she gets pap, mammo, pelvic and bone density scan at [facility])  . GERD (gastroesophageal reflux disease)     Dr Amedeo Plenty is her GI specialist   Past Surgical History  Procedure Laterality Date  . Cesarean section    . Eye surgery      right side x 2, Dr Gershon Crane   Family History  Problem Relation Age of Onset  . Hyperlipidemia Mother   . Stroke Maternal Grandmother   . Cancer Maternal Grandfather   . Diabetes Paternal  Grandmother    Social History  Substance Use Topics  . Smoking status: Current Every Day Smoker -- 1.00 packs/day for 30 years    Types: Cigarettes  . Smokeless tobacco: None  . Alcohol Use: No   OB History    No data available     Review of Systems All other systems negative unless otherwise stated in HPI    Allergies  Review of patient's allergies indicates no known allergies.  Home Medications   Prior to Admission medications   Medication Sig Start Date End Date Taking? Authorizing Provider  ALPRAZolam (XANAX) 0.25 MG tablet TAKE ONE TABLET BY MOUTH ONCE DAILY AS NEEDED. Do not refill till 04/27/2015 05/18/15  Yes Thao P Le, DO  butalbital-aspirin-caffeine-codeine (ASCOMP-CODEINE) 50-325-40-30 MG capsule TAKE ONE CAPSULE BY MOUTH TWICE DAILY AS NEEDED FOR PAIN 05/22/15  Yes Thao P Le, DO  ketorolac (ACULAR) 0.5 % ophthalmic solution Place 1 drop into the right eye 2 (two) times daily.   Yes Historical Provider, MD  methylphenidate (RITALIN) 10 MG tablet Take 10 mg by mouth daily as needed. 07/30/15  Yes Historical Provider, MD  methylphenidate (RITALIN) 20 MG tablet Take 20 mg by mouth daily as needed. 07/30/15  Yes Historical Provider, MD  Multiple Vitamin (MULTIVITAMIN) capsule  Take 1 capsule by mouth daily.   Yes Historical Provider, MD  PARoxetine (PAXIL) 10 MG tablet Take 1 tablet (10 mg total) by mouth daily. 05/18/15  Yes Thao P Le, DO  ranitidine (ZANTAC) 150 MG capsule Take 150 mg by mouth 2 (two) times daily.   Yes Historical Provider, MD   BP 96/62 mmHg  Pulse 64  Temp(Src) 98.3 F (36.8 C) (Oral)  Resp 17  SpO2 96% Physical Exam  Constitutional: She is oriented to person, place, and time. She appears well-developed and well-nourished.  Non-toxic appearance. She does not have a sickly appearance. She does not appear ill.  HENT:  Head: Normocephalic.  Mouth/Throat: Oropharynx is clear and moist.  Hematoma noted to right posterior scalp.  Skin intact.  TTP.    Eyes: Conjunctivae are normal.  Neck: Normal range of motion. Neck supple.  No cervical midline tenderness.   Cardiovascular: Normal rate, regular rhythm and normal heart sounds.   No murmur heard. Pulses:      Radial pulses are 2+ on the right side, and 2+ on the left side.  Pulmonary/Chest: Effort normal and breath sounds normal. No accessory muscle usage or stridor. No respiratory distress. She has no wheezes. She has no rhonchi. She has no rales.  Abdominal: Soft. Bowel sounds are normal. She exhibits no distension. There is no tenderness.  Musculoskeletal: Normal range of motion. She exhibits no tenderness.  No t/l/s midline tenderness.  Left shoulder: No swelling, ecchymosis, erythema, or bruising.  Non-tender to palpation.  FAROM in flexion, extension, abduction, and adduction. Left elbow and wrist non-tender with FAROM.  Lymphadenopathy:    She has no cervical adenopathy.  Neurological: She is alert and oriented to person, place, and time.  Speech clear without dysarthria.  Cranial nerves grossly intact.  Strength and sensation intact bilaterally throughout upper and lower extremities.   Skin: Skin is warm and dry.  Psychiatric: She has a normal mood and affect. Her behavior is normal.    ED Course  Procedures (including critical care time) Labs Review Labs Reviewed  CBC WITH DIFFERENTIAL/PLATELET - Abnormal; Notable for the following:    WBC 12.3 (*)    Neutro Abs 9.4 (*)    All other components within normal limits  BASIC METABOLIC PANEL - Abnormal; Notable for the following:    Glucose, Bld 101 (*)    Creatinine, Ser 1.05 (*)    GFR calc non Af Amer 57 (*)    All other components within normal limits  Randolm Idol, ED    Imaging Review Dg Chest 2 View  08/21/2015  CLINICAL DATA:  Chest pain for approximately 1 week.  Dizziness. EXAM: CHEST  2 VIEW COMPARISON:  August 01, 2008 FINDINGS: There is no edema or consolidation. The heart size and pulmonary  vascularity are normal. No adenopathy. No pneumothorax. No bone lesions. IMPRESSION: No edema or consolidation. Electronically Signed   By: Lowella Grip III M.D.   On: 08/21/2015 13:11   Dg Shoulder Left  08/21/2015  CLINICAL DATA:  Pain for approximately 1 week EXAM: LEFT SHOULDER - 2+ VIEW COMPARISON:  None. FINDINGS: Oblique and Y scapular images were obtained. There is no fracture or dislocation. The joint spaces appear normal. No erosive change or intra-articular calcification. Visualized left lung is clear. IMPRESSION: No fracture or dislocation.  No appreciable arthropathy. Electronically Signed   By: Lowella Grip III M.D.   On: 08/21/2015 13:07   I have personally reviewed and evaluated these images and lab  results as part of my medical decision-making.   EKG Interpretation   Date/Time:  Saturday August 21 2015 14:10:17 EDT Ventricular Rate:  67 PR Interval:  122 QRS Duration: 83 QT Interval:  412 QTC Calculation: 435 R Axis:   69 Text Interpretation:  Sinus rhythm Normal ECG No old tracing to compare  Confirmed by GOLDSTON  MD, Edwards ([id number]) on 08/21/2015 2:12:38 PM      MDM   Final diagnoses:  Left shoulder pain    Patient presents with left shoulder pain and syncopal fall.  VSS, NAD.  On exam, hematoma to posterior parietal scalp.  No cervical midline tenderness.  Left shoulder non-tender to palpation.  FAROM.  No ecchymosis, bruising, erythema.  Good strength and pulses.  Sensation intact. I suspect syncope related to sudden severe pain.  Doubt cardiac etiology given lack of CP, SOB, and resolution of symptoms.  However, given patient's age will obtain CBC, BMP, troponin x 1, CXR, and imaging of left shoulder.  Patient refused ibuprofen.  Recommend Tylenol or any preferred OTC pain medication . Anticipate discharge home with Naproxen and PCP follow up.  Patient given orthopedic follow up as well. Discussed return precautions.  Patient agrees and acknowledges the above  plan for discharge.   Case has been discussed with Dr. [name] who agrees with the above plan for discharge.       Gloriann Loan, PA-C 08/21/15 [place], MD 08/22/15 [phone]

## 2015-08-21 NOTE — Discharge Instructions (Signed)

## 2015-08-21 NOTE — ED Notes (Signed)
Pt here for severe left shoulder pain with limited movement. sts this am became more severe, felt weak and dizzy and then fell on the floor. Pt was A&O upon arrival. Hematoma to head. Neuro intact.

## 2015-08-21 NOTE — ED Notes (Signed)
Pt here with left shoulder pain radiating into elbow and upper back. sts due to increased pain this am pt fell causing her to fall back and hit head and tail bone. Hematoma to head and pain in tailbone. Pt able to move left arm with limited ROM. sts painful. Pt also has knot to left wrist with pain.

## 2015-08-21 NOTE — ED Notes (Signed)
Pt remains in room, pt states, "i was not seen by a doctor. You all are ripping shit off of me & I am just as bad as I was when I first got here. I am not paying for a doctor's visit." pt declines offer to speak with charge RN & doctor, [name], [place] spoke with pt prior to discharge, this RN asks if the pt had any further questions or would prefer a wheelchair, pt declines wheelchair & continues to curse, pt informed that I would have my charge RN & doctor come speak with her & if she chose to decline speaking with them I would help her to the lobby to wait for her family, [name], [name] aware

## 2015-08-21 NOTE — ED Notes (Signed)
Christina Skeeter, MD at bedside discussing pts questions & discharge instructions, pt states, "you knew he came & talked to me so why did you ask. I am waiting on my ride." pt informed that she has a Therapist, sports, television & phone access in the lobby and that the charge RN requests that the pt wait for her transportation in the lobby, pt declines wheelchair, pt states, "I don't want you walking with me to the lobby." pt informed that RNs accompany all of their discharged pts to the door. Pt states, " I thought you would use a wheelchair for all of your patients." this RN verbalized that the pt had declined a wheelchair & that I would be happy to provide one for her, pt continues to decline the wheelchair, pts family was waiting for the pt in the lobby, pt has d/c papers in her hand at discharge

## 2015-08-24 ENCOUNTER — Ambulatory Visit (INDEPENDENT_AMBULATORY_CARE_PROVIDER_SITE_OTHER): Payer: BLUE CROSS/BLUE SHIELD

## 2015-08-25 ENCOUNTER — Ambulatory Visit (INDEPENDENT_AMBULATORY_CARE_PROVIDER_SITE_OTHER): Payer: BLUE CROSS/BLUE SHIELD

## 2015-08-25 ENCOUNTER — Ambulatory Visit (INDEPENDENT_AMBULATORY_CARE_PROVIDER_SITE_OTHER): Payer: BLUE CROSS/BLUE SHIELD | Admitting: Emergency Medicine

## 2015-08-25 VITALS — BP 116/60 | HR 60 | Temp 98.5°F | Resp 16 | Ht <= 58 in | Wt 95.2 lb

## 2015-08-25 DIAGNOSIS — M79622 Pain in left upper arm: Secondary | ICD-10-CM

## 2015-08-25 DIAGNOSIS — M542 Cervicalgia: Secondary | ICD-10-CM | POA: Diagnosis not present

## 2015-08-25 DIAGNOSIS — R0789 Other chest pain: Secondary | ICD-10-CM | POA: Diagnosis not present

## 2015-08-25 DIAGNOSIS — G44039 Episodic paroxysmal hemicrania, not intractable: Secondary | ICD-10-CM

## 2015-08-25 DIAGNOSIS — M5010 Cervical disc disorder with radiculopathy, unspecified cervical region: Secondary | ICD-10-CM

## 2015-08-25 DIAGNOSIS — M25522 Pain in left elbow: Secondary | ICD-10-CM

## 2015-08-25 LAB — POCT CBC
GRANULOCYTE PERCENT: 65.5 % (ref 37–80)
HEMATOCRIT: 36.3 % — AB (ref 37.7–47.9)
HEMOGLOBIN: 13.1 g/dL (ref 12.2–16.2)
Lymph, poc: 2.4 (ref 0.6–3.4)
MCH: 35.1 pg — AB (ref 27–31.2)
MCHC: 36.2 g/dL — AB (ref 31.8–35.4)
MCV: 96.9 fL (ref 80–97)
MID (cbc): 0.8 (ref 0–0.9)
MPV: 7.9 fL (ref 0–99.8)
PLATELET COUNT, POC: 202 10*3/uL (ref 142–424)
POC GRANULOCYTE: 6.1 (ref 2–6.9)
POC LYMPH PERCENT: 26.3 %L (ref 10–50)
POC MID %: 8.2 %M (ref 0–12)
RBC: 3.74 M/uL — AB (ref 4.04–5.48)
RDW, POC: 13.4 %
WBC: 9.3 10*3/uL (ref 4.6–10.2)

## 2015-08-25 MED ORDER — GABAPENTIN 100 MG PO CAPS: ORAL_CAPSULE | ORAL | Status: DC

## 2015-08-25 MED ORDER — KETOROLAC TROMETHAMINE 0.5 % OP SOLN: 1.0000 [drp] | Freq: Two times a day (BID) | OPHTHALMIC | Status: DC

## 2015-08-25 MED ORDER — BUTALBITAL-ASA-CAFF-CODEINE 50-325-40-30 MG PO CAPS: ORAL_CAPSULE | ORAL | Status: DC

## 2015-08-25 NOTE — Patient Instructions (Addendum)
IF you received an x-ray today, you will receive an invoice from [facility]. Please contact [facility] at [phone] with questions or concerns regarding your invoice.   IF you received labwork today, you will receive an invoice from [employer]. Please contact [facility] at [phone] with questions or concerns regarding your invoice.   Our billing staff will not be able to assist you with questions regarding bills from these companies.  You will be contacted with the lab results as soon as they are available. The fastest way to get your results is to activate your My Chart account. Instructions are located on the last page of this paperwork. If you have not heard from [place] regarding the results in 2 weeks, please contact this office.     Cervical Radiculopathy Cervical radiculopathy happens when a nerve in the neck (cervical nerve) is pinched or bruised. This condition can develop because of an injury or as part of the normal aging process. Pressure on the cervical nerves can cause pain or numbness that runs from the neck all the way down into the arm and fingers. Usually, this condition gets better with rest. Treatment may be needed if the condition does not improve.  CAUSES This condition may be caused by:  Injury.  Slipped (herniated) disk.  Muscle tightness in the neck because of overuse.  Arthritis.  Breakdown or degeneration in the bones and joints of the spine (spondylosis) due to aging.  Bone spurs that may develop near the cervical nerves. SYMPTOMS Symptoms of this condition include:  Pain that runs from the neck to the arm and hand. The pain can be severe or irritating. It may be worse when the neck is moved.  Numbness or weakness in the affected arm and hand. DIAGNOSIS This condition may be diagnosed based on symptoms, medical history, and a physical exam. You may also have tests, including:  X-rays.  CT  scan.  MRI.  Electromyogram (EMG).  Nerve conduction tests. TREATMENT In many cases, treatment is not needed for this condition. With rest, the condition usually gets better over time. If treatment is needed, options may include:  Wearing a soft neck collar for short periods of time.  Physical therapy to strengthen your neck muscles.  Medicines, such as NSAIDs, oral corticosteroids, or spinal injections.  Surgery. This may be needed if other treatments do not help. Various types of surgery may be done depending on the cause of your problems. HOME CARE INSTRUCTIONS Managing Pain  Take over-the-counter and prescription medicines only as told by your health care provider.  If directed, apply ice to the affected area.  Put ice in a plastic bag.  Place a towel between your skin and the bag.  Leave the ice on for 20 minutes, 2-3 times per day.  If ice does not help, you can try using heat. Take a warm shower or warm bath, or use a heat pack as told by your health care provider.  Try a gentle neck and shoulder massage to help relieve symptoms. Activity  Rest as needed. Follow instructions from your health care provider about any restrictions on activities.  Do stretching and strengthening exercises as told by your health care provider or physical therapist. General Instructions  If you were given a soft collar, wear it as told by your health care provider.  Use a flat pillow when you sleep.  Keep all follow-up visits as told by your health care provider. This is important. SEEK  MEDICAL CARE IF:  Your condition does not improve with treatment. SEEK IMMEDIATE MEDICAL CARE IF:  Your pain gets much worse and cannot be controlled with medicines.  You have weakness or numbness in your hand, arm, face, or leg.  You have a high fever.  You have a stiff, rigid neck.  You lose control of your bowels or your bladder (have incontinence).  You have trouble with walking,  balance, or speaking.   This information is not intended to replace advice given to you by your health care provider. Make sure you discuss any questions you have with your health care provider.   Document Released: 02/14/2001 Document Revised: 02/10/2015 Document Reviewed: 07/16/2014 Elsevier Interactive Patient Education [employer].

## 2015-08-25 NOTE — Progress Notes (Addendum)
By signing my name below, I, Raven Small, attest that this documentation has been prepared under the direction and in the presence of Arlyss Queen, MD.  Electronically Signed: Thea Alken, ED Scribe. 08/24/2015. 9:45 AM.  Chief Complaint:  Chief Complaint  Patient presents with  . Follow-up    arm pain and pt would like to know labs from last visit    HPI: Christina Hinton is a 60 y.o. female who reports to [facility] today f/u left arm pain. Pt states left arm pain began 7 days ago with a burning pain. No injury to left arm or shoulder. She reports pain worsened 2 days later stating she had difficulty getting undressed due to arm pain and heaviness. The following day while outside smoking a cigarettes she became dizzy. She states the next thing she knew her grandson was standing over and was being transported to ED by EMS. At the ED she received CXR and left shoulder XR which was normal. Troponin was normal. EKG was normal. WBC was elevated at 12.3.  Pt is still having left arm pain with movement. She denies CP.   Past Medical History  Diagnosis Date  . Anxiety   . Arthritis   . Cataract   . Osteoporosis   . Retinal defect     swelling, Dr Zadie Rhine   . 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 Ovarian cyst     right , Dr Radene Knee is her ob.gyn ( she gets pap, mammo, pelvic and bone density scan at [facility])  . GERD (gastroesophageal reflux disease)     Dr Amedeo Plenty is her GI specialist   Past Surgical History  Procedure Laterality Date  . Cesarean section    . Eye surgery      right side x 2, Dr Gershon Crane   Social History   Social History  . Marital Status: Single    Spouse Name: N/A  . Number of Children: N/A  . Years of Education: N/A   Social History Main Topics  . Smoking status: Current Every Day Smoker -- 1.00 packs/day for 30 years    Types: Cigarettes  . Smokeless tobacco: None  .  Alcohol Use: No  . Drug Use: No  . Sexual Activity: Yes   Other Topics Concern  . None   Social History Narrative   Family History  Problem Relation Age of Onset  . Hyperlipidemia Mother   . Stroke Maternal Grandmother   . Cancer Maternal Grandfather   . Diabetes Paternal Grandmother    No Known Allergies Prior to Admission medications   Medication Sig Start Date End Date Taking? Authorizing Provider  ALPRAZolam (XANAX) 0.25 MG tablet TAKE ONE TABLET BY MOUTH ONCE DAILY AS NEEDED. Do not refill till 04/27/2015 05/18/15  Yes Thao P Le, DO  butalbital-aspirin-caffeine-codeine (ASCOMP-CODEINE) 50-325-40-30 MG capsule TAKE ONE CAPSULE BY MOUTH TWICE DAILY AS NEEDED FOR PAIN 05/22/15  Yes Thao P Le, DO  ketorolac (ACULAR) 0.5 % ophthalmic solution Place 1 drop into the right eye 2 (two) times daily.   Yes Historical Provider, MD  methylphenidate (RITALIN) 10 MG tablet Take 10 mg by mouth daily as needed. 07/30/15  Yes Historical Provider, MD  methylphenidate (RITALIN) 20 MG tablet Take 20 mg by mouth daily as needed. 07/30/15  Yes Historical Provider, MD  Multiple Vitamin (MULTIVITAMIN) capsule Take 1  capsule by mouth daily.   Yes Historical Provider, MD  PARoxetine (PAXIL) 10 MG tablet Take 1 tablet (10 mg total) by mouth daily. 05/18/15  Yes Thao P Le, DO  ranitidine (ZANTAC) 150 MG capsule Take 150 mg by mouth 2 (two) times daily.   Yes Historical Provider, MD     ROS: The patient denies fevers, chills, night sweats, unintentional weight loss, chest pain, palpitations, wheezing, dyspnea on exertion, nausea, vomiting, abdominal pain, dysuria, hematuria, melena, numbness, weakness, or tingling.   All other systems have been reviewed and were otherwise negative with the exception of those mentioned in the HPI and as above.    PHYSICAL EXAM: Filed Vitals:   08/25/15 0908  BP: 116/60  Pulse: 60  Temp: 98.5 F (36.9 C)  Resp: 16   Body mass index is 20.6 kg/(m^2).   General:  Alert, no acute distress HEENT:  Normocephalic, atraumatic, oropharynx patent. Eye: [name] Cardiovascular:  Regular rate and rhythm, no rubs murmurs or gallops.  No Carotid bruits, radial pulse intact. No pedal edema.  Respiratory: Clear to auscultation bilaterally.  No wheezes, rales, or rhonchi.  No cyanosis, no use of accessory musculature Abdominal: No organomegaly, abdomen is soft and non-tender, positive bowel sounds.  No masses. Musculoskeletal: Gait intact. No edema.Tender over the left upper arm but has good ROM with minimal rotation. No rotator cuff weakness.  Skin: No rashes. Neurologic: Facial musculature symmetric. Psychiatric: Patient acts appropriately throughout our interaction. Lymphatic: No cervical or submandibular lymphadenopathy   LABS: Results for orders placed or performed in visit on 08/25/15  POCT CBC  Result Value Ref Range   WBC 9.3 4.6 - 10.2 K/uL   Lymph, poc 2.4 0.6 - 3.4   POC LYMPH PERCENT 26.3 10 - 50 %L   MID (cbc) 0.8 0 - 0.9   POC MID % 8.2 0 - 12 %M   POC Granulocyte 6.1 2 - 6.9   Granulocyte percent 65.5 37 - 80 %G   RBC 3.74 (A) 4.04 - 5.48 M/uL   Hemoglobin 13.1 12.2 - 16.2 g/dL   HCT, POC 36.3 (A) 37.7 - 47.9 %   MCV 96.9 80 - 97 fL   MCH, POC 35.1 (A) 27 - 31.2 pg   MCHC 36.2 (A) 31.8 - 35.4 g/dL   RDW, POC 13.4 %   Platelet Count, POC 202 142 - 424 K/uL   MPV 7.9 0 - 99.8 fL    EKG/XRAY:    Dg Cervical Spine Complete  08/25/2015  CLINICAL DATA:  Neck pain, no known injury or other etiology. EXAM: CERVICAL SPINE - COMPLETE 4+ VIEW COMPARISON:  None in PACs FINDINGS: The cervical vertebral bodies are preserved in height. There is disc space narrowing at C5-6 and C6-7. There is grade 1 anterolisthesis of C5 with respect to C6 and C6 with respect to C7. This amounts to approximately 3 mm anterolisthesis at C5-6 and 2 mm at C6-7. There is facet joint hypertrophy at multiple levels. There is bony encroachment upon the neural foramina  in the mid cervical spine bilaterally. The odontoid is intact. The prevertebral soft tissue spaces appear normal. IMPRESSION: 1. There is no compression fracture. 2. There is disc space narrowing at C5-6 and C6-7 with grade 1 anterolisthesis of C5 with respect to C6 and C6 with respect to C7. 3. There is facet joint and uncovertebral joint hypertrophy bilaterally which produces encroachment upon the neural foramina from approximately C4 through C6 bilaterally. Electronically Signed   By: Shanon Brow  [place] M.D.   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 She has degenerative disc disease at C5-6 and 6/7 with grade 1 anterolisthesis at C5. This certainly can be causing nerve impingement down the left arm. Appointment to be made with cardiology because patient is 82 and a heavy smoker. Will give a trial of Neurontin to see if that helps. White cell count has returned to normal.I personally performed the services described in this documentation, which was scribed in my presence. The recorded information has been reviewed and is accurate. I did refill her Fioricet and her anti-inflammatory eyedrops. I also gave her a listing of physicians at [facility] who are accepting new patients   Gross sideeffects, risk and benefits, and alternatives of medications d/w patient. Patient is aware that all medications have potential sideeffects and we are unable to predict every sideeffect or drug-drug interaction that may occur.  Arlyss Queen MD 08/25/2015 9:44 AM

## 2015-09-02 ENCOUNTER — Telehealth: Payer: Self-pay

## 2015-09-02 NOTE — Telephone Encounter (Signed)
The following message was originally sent via an in basket message from the referral to the clinical pool, but it does not appear that it was addressed.  The request still shows as not authorized on [employer]'s website.  The patient's appointment is tomorrow; it will be cancelled if the authorization is not received today.  The MRI was denied by [facility]. An appeal can be made through a physician to physician review. Please call [phone] to initiate this review. Case number: [id number].

## 2015-09-03 ENCOUNTER — Inpatient Hospital Stay: Admission: RE | Admit: 2015-09-03 | Payer: BLUE CROSS/BLUE SHIELD | Source: Ambulatory Visit

## 2015-09-03 NOTE — Telephone Encounter (Signed)
Dr Everlene Farrier please appeal for physician review patient appt was today

## 2015-09-06 ENCOUNTER — Telehealth: Payer: Self-pay | Admitting: Family Medicine

## 2015-09-06 NOTE — Telephone Encounter (Signed)
Called patient's insurance for Peer to Peer review. Was told case was closed on 08/30/15, left message on appeal line for appeal of decision.

## 2015-09-07 NOTE — Telephone Encounter (Signed)
Dr Everlene Farrier asked me to try to get someone on the line for him to do an appeal while he was in the office. I had to also LM for CB on Appeals line.

## 2015-09-07 NOTE — Telephone Encounter (Signed)
This has to be done by Thursday. SEE NOTES BELOW.

## 2015-09-07 NOTE — Telephone Encounter (Signed)
Dr Everlene Farrier had me CB and leave his cell # [phone] for Appeals dept to call him back for Peer to peer review.

## 2015-09-07 NOTE — Telephone Encounter (Signed)
Appeals dept called back and advised that she had already left this message for Debbie yesterday, but wanted to tell Dr Everlene Farrier, also that the appeal info HAS to be faxed in. They do not do appeals by phone. Fax clin info to [phone], ATTN APPEALS dept. The clinical info must contain pt's name and DOB.

## 2015-09-08 NOTE — Telephone Encounter (Signed)
I wrote an appeal letter at Dr Perfecto Kingdom request, and had him review and sign. Faxed along w/OV notes and xray results to number below and got confirmation of receipt. Pending decision.

## 2015-09-09 ENCOUNTER — Telehealth: Payer: Self-pay

## 2015-09-09 NOTE — Telephone Encounter (Signed)
The request for an MRI Cervical Spine was denied following the appeals process with [employer].  Per [facility], this is the final level of review available to the physician.  If desired, information of the criteria/guidelines used for this determination can be obtained by calling [phone].

## 2015-09-09 NOTE — Telephone Encounter (Signed)
See denial by insurance company.

## 2015-09-09 NOTE — Telephone Encounter (Signed)
Please call patient and let her know the insurance company denied I request for an MRI. Please see me in the office next week if she does not continue to improve.

## 2015-09-10 ENCOUNTER — Ambulatory Visit: Payer: BLUE CROSS/BLUE SHIELD | Admitting: Cardiology

## 2015-09-10 NOTE — Telephone Encounter (Signed)
LMOVM - Dr. Caren Griffins message - pt to come in next week.  Gave her the dates Dr. Everlene Farrier working Tues morning, Wednesday till 5pm, Friday morning.

## 2015-09-24 ENCOUNTER — Other Ambulatory Visit: Payer: Self-pay | Admitting: Family Medicine

## 2015-09-24 NOTE — Telephone Encounter (Signed)
I have refilled her medications - she will need an OV before more refills - please let her know about out controlled substance policy that she will need to pick a PCP since Dr Everlene Farrier is leaving in Oct

## 2015-09-24 NOTE — Telephone Encounter (Signed)
Only Dr Marin Comment has Rxd in the past. Please review.

## 2015-09-27 NOTE — Telephone Encounter (Signed)
Notified pt of RF and Sarah's message. Pt stated that she had gotten a call from [facility] indicating that Dr Everlene Farrier wants to see her, so she will try to walk in this week. Faxed Rx to [facility] per pt req. She is [facility] all of [facility] Rxs [facility] now.

## 2015-09-28 ENCOUNTER — Ambulatory Visit (INDEPENDENT_AMBULATORY_CARE_PROVIDER_SITE_OTHER): Payer: BLUE CROSS/BLUE SHIELD

## 2015-09-28 ENCOUNTER — Ambulatory Visit (HOSPITAL_COMMUNITY)
Admission: RE | Admit: 2015-09-28 | Discharge: 2015-09-28 | Disposition: A | Discharge status: Home / Self Care | Payer: BLUE CROSS/BLUE SHIELD | Source: Ambulatory Visit | Attending: Emergency Medicine | Admitting: Emergency Medicine

## 2015-09-28 ENCOUNTER — Other Ambulatory Visit: Payer: Self-pay | Admitting: Emergency Medicine

## 2015-09-28 ENCOUNTER — Telehealth: Payer: Self-pay | Admitting: Emergency Medicine

## 2015-09-28 ENCOUNTER — Ambulatory Visit (INDEPENDENT_AMBULATORY_CARE_PROVIDER_SITE_OTHER): Payer: BLUE CROSS/BLUE SHIELD | Admitting: Emergency Medicine

## 2015-09-28 VITALS — BP 106/64 | HR 74 | Temp 98.2°F | Resp 18 | Ht <= 58 in | Wt 96.6 lb

## 2015-09-28 DIAGNOSIS — R9389 Abnormal findings on diagnostic imaging of other specified body structures: Secondary | ICD-10-CM

## 2015-09-28 DIAGNOSIS — M542 Cervicalgia: Secondary | ICD-10-CM

## 2015-09-28 DIAGNOSIS — F17210 Nicotine dependence, cigarettes, uncomplicated: Secondary | ICD-10-CM

## 2015-09-28 DIAGNOSIS — J432 Centrilobular emphysema: Secondary | ICD-10-CM | POA: Diagnosis not present

## 2015-09-28 DIAGNOSIS — R918 Other nonspecific abnormal finding of lung field: Secondary | ICD-10-CM

## 2015-09-28 DIAGNOSIS — R938 Abnormal findings on diagnostic imaging of other specified body structures: Secondary | ICD-10-CM | POA: Diagnosis not present

## 2015-09-28 DIAGNOSIS — R0789 Other chest pain: Secondary | ICD-10-CM

## 2015-09-28 DIAGNOSIS — M5010 Cervical disc disorder with radiculopathy, unspecified cervical region: Secondary | ICD-10-CM

## 2015-09-28 MED ORDER — AZITHROMYCIN 250 MG PO TABS: ORAL_TABLET | ORAL | Status: DC

## 2015-09-28 NOTE — Patient Instructions (Signed)
     IF you received an x-ray today, you will receive an invoice from [facility]. Please contact [facility] at [phone] with questions or concerns regarding your invoice.   IF you received labwork today, you will receive an invoice from [facility]. Please contact [facility] at [phone] with questions or concerns regarding your invoice.   Our billing staff will not be able to assist you with questions regarding bills from these companies.  You will be contacted with the lab results as soon as they are available. The fastest way to get your results is to activate your My Chart account. Instructions are located on the last page of this paperwork. If you have not heard from us regarding the results in 2 weeks, please contact this office.      

## 2015-09-28 NOTE — Telephone Encounter (Signed)
Message left. Will check Stat D dimer in AM.Please call and be sure she comes in in the AM.

## 2015-09-28 NOTE — Progress Notes (Addendum)
Patient ID: Christina Hinton, female   DOB: May 29, 1956, 60 y.o.   MRN: [id number]    By signing my name below, I, [name], attest that this documentation has been prepared under the direction and in the presence of [name], MD Electronically Signed: Ladene Artist, ED Scribe 09/28/2015 at 1:28 PM.  Chief Complaint:  Chief Complaint  Patient presents with  . Follow-up    treatment plan   HPI: Christina Hinton is a 60 y.o. female who reports to [facility] today for a follow-up regarding left arm pain. Pt states that left arm pain has resolved after 1 month, however, she is still experiencing posterior left shoulder pain. Pt describes shoulder pain as a burning sensation that is exacerbated with palpation and movement. She denies fall, injury or heavy lifting.   Shortness of Breath  Pt also presents with persistent shortness of breath since 2 AM this morning. She reports associated chest heaviness since this morning as well. Triage SpO2: 97%. Pt denies chest pain. She is a smoker but states that she has cut back.   Past Medical History  Diagnosis Date  . Anxiety   . Arthritis   . Cataract   . Osteoporosis   . Retinal defect     swelling, Dr Zadie Rhine   . 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 Ovarian cyst     right , Dr Radene Knee is her ob.gyn ( she gets pap, mammo, pelvic and bone density scan at [facility])  . GERD (gastroesophageal reflux disease)     Dr Amedeo Plenty is her GI specialist   Past Surgical History  Procedure Laterality Date  . Cesarean section    . Eye surgery      right side x 2, Dr Gershon Crane   Social History   Social History  . Marital Status: Single    Spouse Name: N/A  . Number of Children: N/A  . Years of Education: N/A   Social History Main Topics  . Smoking status: Current Every Day Smoker -- 1.00 packs/day for 30 years    Types: Cigarettes  . Smokeless tobacco:  None  . Alcohol Use: No  . Drug Use: No  . Sexual Activity: Yes   Other Topics Concern  . None   Social History Narrative   Family History  Problem Relation Age of Onset  . Hyperlipidemia Mother   . Stroke Maternal Grandmother   . Cancer Maternal Grandfather   . Diabetes Paternal Grandmother    No Known Allergies Prior to Admission medications   Medication Sig Start Date End Date Taking? Authorizing Provider  ALPRAZolam Duanne Moron) 0.25 MG tablet TAKE ONE TABLET BY MOUTH ONCE DAILY AS NEEDED 09/24/15   Mancel Bale, PA-C  butalbital-aspirin-caffeine-codeine (ASCOMP-CODEINE) [phone] MG capsule TAKE ONE CAPSULE BY MOUTH TWICE DAILY AS NEEDED FOR PAIN 08/25/15   [name], MD  butalbital-aspirin-caffeine-codeine (ASCOMP-CODEINE) [phone] MG capsule TAKE ONE CAPSULE BY MOUTH TWICE DAILY AS NEEDED FOR PAIN 08/25/15   [name], MD  gabapentin (NEURONTIN) 100 MG capsule Take 1-2 capsules 3 times a day. Caution for drowsiness. 08/25/15   [name], MD  ketorolac (ACULAR) 0.5 % ophthalmic solution Place 1 drop into the right eye 2 (two) times daily. 08/25/15   [name], MD  methylphenidate (RITALIN) 10 MG tablet Take 10 mg by mouth daily  as needed. 07/30/15   Historical Provider, MD  methylphenidate (RITALIN) 20 MG tablet Take 20 mg by mouth daily as needed. 07/30/15   Historical Provider, MD  Multiple Vitamin (MULTIVITAMIN) capsule Take 1 capsule by mouth daily.    Historical Provider, MD  PARoxetine (PAXIL) 10 MG tablet Take 1 tablet (10 mg total) by mouth daily. 05/18/15   Thao P Le, DO  ranitidine (ZANTAC) 150 MG capsule Take 150 mg by mouth 2 (two) times daily.    Historical Provider, MD   ROS: The patient denies fevers, chills, night sweats, unintentional weight loss, palpitations, wheezing, nausea, vomiting, abdominal pain, dysuria, hematuria, melena, numbness, weakness, or tingling.   All other systems have been reviewed and were otherwise negative with the exception  of those mentioned in the HPI and as above.    PHYSICAL EXAM: Filed Vitals:   09/28/15 1253  BP: 106/64  Pulse: 74  Temp: 98.2 F (36.8 C)  Resp: 18   Body mass index is 20.9 kg/(m^2).  General: Alert, no acute distress HEENT:  Normocephalic, atraumatic, oropharynx patent. Eye: [name] Cardiovascular: Regular rate and rhythm, no rubs, murmurs or gallops. No Carotid bruits, radial pulse intact. No pedal edema.  Respiratory: Clear to auscultation bilaterally. No wheezes, rales, or rhonchi. No cyanosis, no use of accessory musculature Abdominal: No organomegaly, abdomen is soft and non-tender, positive bowel sounds. No masses. Musculoskeletal: Gait intact. No edema. Tenderness over the L parascapular muscles. Pain with movement of L arm. No chest wall tenderness.  Skin: No rashes. Neurologic: Facial musculature symmetric. Psychiatric: Patient acts appropriately throughout our interaction. Lymphatic: No cervical or submandibular lymphadenopathy  LABS:  EKG/XRAY:   Primary read interpreted by Dr. Everlene Farrier at [facility]. Dg Chest 2 View  09/28/2015  CLINICAL DATA:  Shortness of breath for 1 day EXAM: CHEST  2 VIEW COMPARISON:  08/21/2015 FINDINGS: Cardiac shadow is within normal limits. The lungs are hyperaerated consistent with COPD. No focal infiltrate or sizable effusion is noted. There is some increased density noted in the right infrahilar region which projects in the right middle lobe on the lateral projection. This was not as well appreciated on prior exams dating back to 2010. No bony abnormality is noted. IMPRESSION: Questionable increased fullness in the right middle lobe projecting in the right infrahilar region. Noncontrast CT is recommended for further evaluation. These results will be called to the ordering clinician or representative by the Radiologist Assistant, and communication documented in the PACS or zVision Dashboard. Electronically Signed   By: Inez Catalina M.D.   On:  09/28/2015 14:16     ASSESSMENT/PLAN: We'll proceed with CT chest with contrast. The radiologist is concerned about an area on the right below the right hilum. Not clear if this represents infection or a growth. She went from here to have her CT done. Had already called in a prescription for Z-Pak for her to have. She has access to an albuterol inhaler and can use that as needed for her chest tightness. Her EKG was unchanged from previous. She does continue to have a cervical radiculopathy and we will see if we can get her MRI approved.I personally performed the services described in this documentation, which was scribed in my presence. The recorded information has been reviewed and is accurate.    Gross sideeffects, risk and benefits, and alternatives of medications d/w patient. Patient is aware that all medications have potential sideeffects and we are unable to predict every sideeffect or drug-drug interaction that may occur.  Arlyss Queen MD 09/28/2015 1:17 PM

## 2015-09-29 ENCOUNTER — Other Ambulatory Visit (INDEPENDENT_AMBULATORY_CARE_PROVIDER_SITE_OTHER): Payer: BLUE CROSS/BLUE SHIELD | Admitting: *Deleted

## 2015-09-29 DIAGNOSIS — R918 Other nonspecific abnormal finding of lung field: Secondary | ICD-10-CM | POA: Diagnosis not present

## 2015-09-29 LAB — D-DIMER, QUANTITATIVE (NOT AT ARMC): D DIMER QUANT: 0.26 ug{FEU}/mL (ref 0.00–0.48)

## 2015-09-29 NOTE — Telephone Encounter (Signed)
She came in.

## 2015-09-29 NOTE — Progress Notes (Signed)
Lab visit only. 

## 2015-11-03 ENCOUNTER — Ambulatory Visit (INDEPENDENT_AMBULATORY_CARE_PROVIDER_SITE_OTHER): Payer: BLUE CROSS/BLUE SHIELD | Admitting: Emergency Medicine

## 2015-11-03 ENCOUNTER — Other Ambulatory Visit: Payer: Self-pay | Admitting: Emergency Medicine

## 2015-11-03 ENCOUNTER — Ambulatory Visit (INDEPENDENT_AMBULATORY_CARE_PROVIDER_SITE_OTHER): Payer: BLUE CROSS/BLUE SHIELD

## 2015-11-03 VITALS — BP 110/68 | HR 66 | Temp 97.9°F | Resp 18 | Ht <= 58 in | Wt 97.8 lb

## 2015-11-03 DIAGNOSIS — F17210 Nicotine dependence, cigarettes, uncomplicated: Secondary | ICD-10-CM | POA: Diagnosis not present

## 2015-11-03 DIAGNOSIS — J432 Centrilobular emphysema: Secondary | ICD-10-CM

## 2015-11-03 DIAGNOSIS — R0789 Other chest pain: Secondary | ICD-10-CM

## 2015-11-03 DIAGNOSIS — R938 Abnormal findings on diagnostic imaging of other specified body structures: Secondary | ICD-10-CM | POA: Diagnosis not present

## 2015-11-03 DIAGNOSIS — R9389 Abnormal findings on diagnostic imaging of other specified body structures: Secondary | ICD-10-CM

## 2015-11-03 DIAGNOSIS — J449 Chronic obstructive pulmonary disease, unspecified: Secondary | ICD-10-CM | POA: Insufficient documentation

## 2015-11-03 LAB — PULMONARY FUNCTION TEST

## 2015-11-03 MED ORDER — ALPRAZOLAM 0.25 MG PO TABS: ORAL_TABLET | ORAL | Status: DC

## 2015-11-03 MED ORDER — KETOROLAC TROMETHAMINE 0.5 % OP SOLN: 1.0000 [drp] | Freq: Two times a day (BID) | OPHTHALMIC | Status: DC

## 2015-11-03 MED ORDER — ALBUTEROL SULFATE HFA 108 (90 BASE) MCG/ACT IN AERS: 2.0000 | INHALATION_SPRAY | RESPIRATORY_TRACT | Status: DC | PRN

## 2015-11-03 MED ORDER — BECLOMETHASONE DIPROPIONATE 80 MCG/ACT IN AERS
INHALATION_SPRAY | RESPIRATORY_TRACT | Status: DC
Start: 2015-11-03 — End: 2016-02-18

## 2015-11-03 MED ORDER — BUTALBITAL-ASA-CAFF-CODEINE 50-325-40-30 MG PO CAPS: ORAL_CAPSULE | ORAL | Status: DC

## 2015-11-03 NOTE — Progress Notes (Addendum)
Patient ID: Christina Hinton, female   DOB: 12-22-1955, 60 y.o.   MRN: [id number]    By signing my name below, I, Christina Hinton, attest that this documentation has been prepared under the direction and in the presence of Christina Russian, MD Electronically Signed: Ladene Hinton, ED Scribe 11/03/2015 at 10:50 AM.  Chief Complaint:  Chief Complaint  Patient presents with  . Follow-up    for imaging results  . Medication Refill    for all medications   . Handicap Placard    Pt. has a form inside of folder to be signed    HPI: Christina Hinton is a 60 y.o. female who reports to [facility] today for imaging results. Pt had a CT chest on 09/28/15 that showed 1.8x1.8 cm area of probable focal PNA with bilateral central lobular emphysema. Today, she reports one episode of chest heaviness that occurred last week that has since resolved. Pt states that she ran out of her inhaler and had to use someone else's inhaler during this episode with relief. She currently smokes less than half a pack of cigarettes daily.   Handicap Placard Pt has a form with her at this visit to renew her handicap placard for sciatica. She states that sciatica is exacerbated with walking long distances in the parking deck.   Pt's last day of work is 11/05/15 but she returns on 01/04/16.  Past Medical History  Diagnosis Date  . Anxiety   . Arthritis   . Cataract   . Osteoporosis   . Retinal defect     swelling, Dr Christina Hinton   . Migraine     she has a dependency on Fioricet with codeine, she is allowed to take a max of 2 tabs daily per Dr Christina Hinton  . Ovarian cyst     right , Dr Christina Hinton is her ob.gyn ( she gets pap, mammo, pelvic and bone density scan at [facility])  . GERD (gastroesophageal reflux disease)     Dr Christina Hinton is her GI specialist   Past Surgical History  Procedure Laterality Date  . Cesarean section    . Eye surgery      right side x 2, Dr Gershon Crane   Social History   Social History  . Marital Status: Single      Spouse Name: N/A  . Number of Children: N/A  . Years of Education: N/A   Social History Main Topics  . Smoking status: Current Every Day Smoker -- 1.00 packs/day for 30 years    Types: Cigarettes  . Smokeless tobacco: None  . Alcohol Use: No  . Drug Use: No  . Sexual Activity: Yes   Other Topics Concern  . None   Social History Narrative   Family History  Problem Relation Age of Onset  . Hyperlipidemia Mother   . Stroke Maternal Grandmother   . Cancer Maternal Grandfather   . Diabetes Paternal Grandmother    No Known Allergies Prior to Admission medications   Medication Sig Start Date End Date Taking? Authorizing Provider  ALPRAZolam Duanne Moron) 0.25 MG tablet TAKE ONE TABLET BY MOUTH ONCE DAILY AS NEEDED 09/24/15  Yes Christina Bale, PA-C  butalbital-aspirin-caffeine-codeine (ASCOMP-CODEINE) 50-325-40-30 MG capsule TAKE ONE CAPSULE BY MOUTH TWICE DAILY AS NEEDED FOR PAIN 08/25/15  Yes Christina Russian, MD  butalbital-aspirin-caffeine-codeine (ASCOMP-CODEINE) [phone] MG capsule TAKE ONE CAPSULE BY MOUTH TWICE DAILY AS NEEDED FOR PAIN 08/25/15  Yes Christina Russian, MD  ketorolac (ACULAR) 0.5 % ophthalmic  solution Place 1 drop into the right eye 2 (two) times daily. 08/25/15  Yes Christina Russian, MD  methylphenidate (RITALIN) 10 MG tablet Take 10 mg by mouth daily as needed. 07/30/15  Yes Historical Provider, MD  methylphenidate (RITALIN) 20 MG tablet Take 20 mg by mouth daily as needed. 07/30/15  Yes Historical Provider, MD  Multiple Vitamin (MULTIVITAMIN) capsule Take 1 capsule by mouth daily.   Yes Historical Provider, MD  PARoxetine (PAXIL) 10 MG tablet Take 1 tablet (10 mg total) by mouth daily. 05/18/15  Yes Thao P Le, DO  ranitidine (ZANTAC) 150 MG capsule Take 150 mg by mouth 2 (two) times daily.   Yes Historical Provider, MD  gabapentin (NEURONTIN) 100 MG capsule Take 1-2 capsules 3 times a day. Caution for drowsiness. Patient not taking: Reported on 09/28/2015 08/25/15   Christina Russian, MD   ROS: The patient denies fevers, chills, night sweats, unintentional weight loss, chest pain, palpitations, wheezing, nausea, vomiting, abdominal pain, dysuria, hematuria, melena, numbness, weakness, or tingling.   All other systems have been reviewed and were otherwise negative with the exception of those mentioned in the HPI and as above.    PHYSICAL EXAM: Filed Vitals:   11/03/15 0942  BP: 110/68  Pulse: 66  Temp: 97.9 F (36.6 C)  Resp: 18   Body mass index is 21.16 kg/(m^2).  General: Alert, no acute distress HEENT:  Normocephalic, atraumatic, oropharynx patent. Eye: [name] [facility] Cardiovascular: Regular rate and rhythm, no rubs murmurs or gallops. No Carotid bruits, radial pulse intact. No pedal edema.  Respiratory: Symmetrical breath sounds. Poor air exchange. No wheezes, rales, or rhonchi. No cyanosis, no use of accessory musculature.  Abdominal: No organomegaly, abdomen is soft and non-tender, positive bowel sounds. No masses. Musculoskeletal: Gait intact. No edema, tenderness Skin: No rashes. Neurologic: Facial musculature symmetric. Psychiatric: Patient acts appropriately throughout our interaction. Lymphatic: No cervical or submandibular lymphadenopathy  LABS:  EKG/XRAY:   Primary read interpreted by Dr. Everlene Hinton at [facility].   Dg Chest 2 View  11/03/2015  CLINICAL DATA:  Abnormal CT of the chest, chest tightness, smoking history EXAM: CHEST  2 VIEW COMPARISON:  Chest x-ray of 09/28/2015 and CT chest of the same day FINDINGS: The opacity on chest x-ray medially at the right lung base on frontal chest x-ray is not as well seen and may have resolved. No new infiltrate or effusion is seen. In review of the CT however that area adjacent to the right heart border in the right middle lobe appeared more solid than generally is seen with pneumonia and as recommended earlier, a followup CT of the chest is advised to ensure resolution of this opacity. This area could easily be  hidden by chest x-ray due to its location. Mediastinal and hilar contours are unremarkable. The heart is within normal limits in size. No bony abnormality is seen. IMPRESSION: 1. The opacity questioned by chest x-ray medially at the right lung base is not as well seen. 2. However I would suggest a follow-up CT of the chest without IV contrast media to ensure resolution of the previously described right middle lobe opacity as described above. Electronically Signed   By: Ivar Drape M.D.   On: 11/03/2015 11:13    ASSESSMENT/PLAN: The patient has centrilobular emphysema. 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 She has an FVC of 101% and an FEV1 of 83%. She is placed on Qvar 1 puff twice a day and albuterol as a rescue inhaler.  CT scan will be ordered to be obtained after the week of June 20.I personally performed the services described in this documentation, which was scribed in my presence. The recorded information has been reviewed and is accurate.    Gross sideeffects, risk and benefits, and alternatives of medications d/w patient. Patient is aware that all medications have potential sideeffects and we are unable to predict every sideeffect or drug-drug interaction that may occur.  Arlyss Queen MD 11/03/2015 10:36 AM

## 2015-11-03 NOTE — Addendum Note (Signed)
Addended by: Arlyss Queen A on: 11/03/2015 12:10 PM   Modules accepted: Orders

## 2015-11-03 NOTE — Patient Instructions (Signed)
     IF you received an x-ray today, you will receive an invoice from [facility]. Please contact [facility] at [phone] with questions or concerns regarding your invoice.   IF you received labwork today, you will receive an invoice from [facility]. Please contact [facility] at [phone] with questions or concerns regarding your invoice.   Our billing staff will not be able to assist you with questions regarding bills from these companies.  You will be contacted with the lab results as soon as they are available. The fastest way to get your results is to activate your My Chart account. Instructions are located on the last page of this paperwork. If you have not heard from us regarding the results in 2 weeks, please contact this office.      

## 2015-12-13 ENCOUNTER — Ambulatory Visit
Admission: RE | Admit: 2015-12-13 | Discharge: 2015-12-13 | Disposition: A | Discharge status: Home / Self Care | Payer: BLUE CROSS/BLUE SHIELD | Source: Ambulatory Visit | Attending: Emergency Medicine | Admitting: Emergency Medicine

## 2015-12-13 DIAGNOSIS — R9389 Abnormal findings on diagnostic imaging of other specified body structures: Secondary | ICD-10-CM

## 2015-12-20 ENCOUNTER — Other Ambulatory Visit: Payer: Self-pay | Admitting: Emergency Medicine

## 2015-12-20 DIAGNOSIS — I2584 Coronary atherosclerosis due to calcified coronary lesion: Principal | ICD-10-CM

## 2015-12-20 DIAGNOSIS — I2510 Atherosclerotic heart disease of native coronary artery without angina pectoris: Secondary | ICD-10-CM

## 2016-02-18 ENCOUNTER — Ambulatory Visit (INDEPENDENT_AMBULATORY_CARE_PROVIDER_SITE_OTHER): Payer: BLUE CROSS/BLUE SHIELD | Admitting: Emergency Medicine

## 2016-02-18 VITALS — BP 102/64 | HR 78 | Temp 98.6°F | Resp 16 | Ht <= 58 in | Wt 94.8 lb

## 2016-02-18 DIAGNOSIS — J209 Acute bronchitis, unspecified: Secondary | ICD-10-CM | POA: Diagnosis not present

## 2016-02-18 DIAGNOSIS — J0100 Acute maxillary sinusitis, unspecified: Secondary | ICD-10-CM | POA: Diagnosis not present

## 2016-02-18 DIAGNOSIS — F17210 Nicotine dependence, cigarettes, uncomplicated: Secondary | ICD-10-CM | POA: Diagnosis not present

## 2016-02-18 DIAGNOSIS — J432 Centrilobular emphysema: Secondary | ICD-10-CM | POA: Diagnosis not present

## 2016-02-18 MED ORDER — ALPRAZOLAM 0.25 MG PO TABS: ORAL_TABLET | ORAL | 2 refills | Status: DC

## 2016-02-18 MED ORDER — BUTALBITAL-ASA-CAFF-CODEINE 50-325-40-30 MG PO CAPS: ORAL_CAPSULE | ORAL | 2 refills | Status: DC

## 2016-02-18 MED ORDER — BECLOMETHASONE DIPROPIONATE 80 MCG/ACT IN AERS: INHALATION_SPRAY | RESPIRATORY_TRACT | 11 refills | Status: DC

## 2016-02-18 MED ORDER — PAROXETINE HCL 10 MG PO TABS: 10.0000 mg | ORAL_TABLET | Freq: Every day | ORAL | 3 refills | Status: DC

## 2016-02-18 MED ORDER — KETOROLAC TROMETHAMINE 0.5 % OP SOLN: 1.0000 [drp] | Freq: Two times a day (BID) | OPHTHALMIC | 1 refills | Status: DC

## 2016-02-18 MED ORDER — ALBUTEROL SULFATE HFA 108 (90 BASE) MCG/ACT IN AERS: 2.0000 | INHALATION_SPRAY | RESPIRATORY_TRACT | 3 refills | Status: AC | PRN

## 2016-02-18 MED ORDER — AMOXICILLIN 500 MG PO CAPS: 500.0000 mg | ORAL_CAPSULE | Freq: Two times a day (BID) | ORAL | 0 refills | Status: DC

## 2016-02-18 NOTE — Patient Instructions (Addendum)
Please call [facility] at [phone] to schedule your appointment for evaluation of coronary calcification. [facility] Policy for Prescribing Controlled Substances (Revised 04/2012) 1. Prescriptions for controlled substances will be filled by ONE provider at [facility] with whom you have established and developed a plan for your care, including follow-up. 2. You are encouraged to schedule an appointment with your prescriber at our appointment center for follow-up visits whenever possible. 3. If you request a prescription for the controlled substance while at [facility] for an acute problem (with someone other than your regular prescriber), you MAY be given a ONE-TIME prescription for a 30-day supply of the controlled substance, to allow time for you to return to see your regular prescriber for additional prescriptions.    IF you received an x-ray today, you will receive an invoice from [facility]. Please contact [facility] at [phone] with questions or concerns regarding your invoice.   IF you received labwork today, you will receive an invoice from [employer]. Please contact [facility] at [phone] with questions or concerns regarding your invoice.   Our billing staff will not be able to assist you with questions regarding bills from these companies.  You will be contacted with the lab results as soon as they are available. The fastest way to get your results is to activate your My Chart account. Instructions are located on the last page of this paperwork. If you have not heard from [place] regarding the results in 2 weeks, please contact this office.

## 2016-02-18 NOTE — Progress Notes (Signed)
Patient ID: Christina Hinton, female   DOB: 09/23/1955, 60 y.o.   MRN: [id number]    By signing my name below, I, [name], attest that this documentation has been prepared under the direction and in the presence of [name], MD Electronically Signed: Ladene Artist, ED Scribe 02/18/2016 at 12:53 PM.  Chief Complaint:  Chief Complaint  Patient presents with  . Medication Refill  . Nasal Congestion    with green mucus x 1 week    HPI: Christina Hinton is a 60 y.o. female who reports to [facility] complaining of nasal congestion with green mucus for the past week. Pt reports associated symptoms of sore throat and productive cough with colored sputum. Pt has tried Robitussin without significant relief. She is still taking Fioricet x2 daily for HAs. Pt has also been taking her albuterol inhaler more frequently. States she typically takes it PRN but has used x2 daily for the past week due to the weather changing. She also takes Qvar. Pt has decreased smoking to 0.5 ppd.   Vision Pt last saw her eye doctor 2 years ago. She wears reading glasses a lot and has worked on a computers a lot over the years which she suspects has made her vision worse. Pt did a visual acuity screening on herself in the room and reports that she had a difficult time viewing line 3; states she thinks she needs a stronger prescription. She is not wearing corrective lenses at this visit.   Rx Refill Pt requests a refill of her albuterol inhaler which she uses PRN.   Past Medical History:  Diagnosis Date  . Anxiety   . Arthritis   . Cataract   . GERD (gastroesophageal reflux disease)    Dr Amedeo Plenty is her GI specialist  . 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 Osteoporosis   . Ovarian cyst    right , Dr Radene Knee is her ob.gyn ( she gets pap, mammo, pelvic and bone density scan at [facility])  . Retinal defect    swelling, Dr  Zadie Rhine    Past Surgical History:  Procedure Laterality Date  . CESAREAN SECTION    . EYE SURGERY     right side x 2, Dr Gershon Crane   Social History   Social History  . Marital status: Single    Spouse name: N/A  . Number of children: N/A  . Years of education: N/A   Social History Main Topics  . Smoking status: Current Every Day Smoker    Packs/day: 1.00    Years: 30.00    Types: Cigarettes  . Smokeless tobacco: None  . Alcohol use No  . Drug use: No  . Sexual activity: Yes   Other Topics Concern  . None   Social History Narrative  . None   Family History  Problem Relation Age of Onset  . Hyperlipidemia Mother   . Stroke Maternal Grandmother   . Cancer Maternal Grandfather   . Diabetes Paternal Grandmother    No Known Allergies Prior to Admission medications   Medication Sig Start Date End Date Taking? Authorizing Provider  albuterol (PROVENTIL HFA;VENTOLIN HFA) 108 (90 Base) MCG/ACT inhaler Inhale 2 puffs into the lungs every 4 (four) hours as needed for wheezing or shortness of breath (cough, shortness of breath or wheezing.). 11/03/15  Yes [name], MD  ALPRAZolam (XANAX) 0.25 MG tablet TAKE ONE TABLET BY MOUTH ONCE DAILY AS NEEDED 11/03/15  Yes [name], MD  beclomethasone (QVAR) 80 MCG/ACT inhaler Use 1 puff twice a day followed by rinsing your mouth out with water. 11/03/15  Yes [name], MD  butalbital-aspirin-caffeine-codeine (ASCOMP-CODEINE) [phone] MG capsule TAKE ONE CAPSULE BY MOUTH TWICE DAILY AS NEEDED FOR PAIN 11/03/15  Yes [name], MD  ketorolac (ACULAR) 0.5 % ophthalmic solution Place 1 drop into the right eye 2 (two) times daily. 11/03/15  Yes [name], MD  methylphenidate (RITALIN) 10 MG tablet Take 10 mg by mouth daily as needed. 07/30/15  Yes Historical Provider, MD  methylphenidate (RITALIN) 20 MG tablet Take 20 mg by mouth daily as needed. 07/30/15  Yes Historical Provider, MD  Multiple Vitamin (MULTIVITAMIN) capsule Take 1  capsule by mouth daily.   Yes Historical Provider, MD  PARoxetine (PAXIL) 10 MG tablet Take 1 tablet (10 mg total) by mouth daily. 05/18/15  Yes Thao P Le, DO  ranitidine (ZANTAC) 150 MG capsule Take 150 mg by mouth 2 (two) times daily.   Yes Historical Provider, MD  gabapentin (NEURONTIN) 100 MG capsule Take 1-2 capsules 3 times a day. Caution for drowsiness. Patient not taking: Reported on 02/18/2016 08/25/15   [name], MD    ROS: The patient denies fevers, chills, night sweats, unintentional weight loss, chest pain, palpitations, wheezing, dyspnea on exertion, nausea, vomiting, abdominal pain, dysuria, hematuria, melena, numbness, weakness, or tingling.   All other systems have been reviewed and were otherwise negative with the exception of those mentioned in the HPI and as above.    PHYSICAL EXAM: Vitals:   02/18/16 1229  BP: 102/64  Pulse: 78  Resp: 16  Temp: 98.6 F (37 C)   Body mass index is 28.93 kg/m.  General: Alert, no acute distress HEENT:  Normocephalic, atraumatic, oropharynx patent. Nasal sinus congestion. Throat and ears are normal.  Eye: [facility], [facility] Cardiovascular:  Regular rate and rhythm, no rubs murmurs or gallops. No Carotid bruits, radial pulse intact. No pedal edema.  Respiratory: Prolongation bilaterally. No wheezes, rales, or rhonchi.  Abdominal: No organomegaly, abdomen is soft and non-tender, positive bowel sounds.  No masses. Musculoskeletal: Gait intact. No edema, tenderness Skin: No rashes. Neurologic: Facial musculature symmetric. Psychiatric: Patient acts appropriately throughout our interaction. Lymphatic: No cervical or submandibular lymphadenopathy  LABS:  EKG/XRAY:   Primary read interpreted by Dr. [facility]lene Farrier at [facility].  ASSESSMENT/PLAN: Her biggest problem is that she continues to smoke. I again told her she must stop smoking. She has centrilobular emphysema with bronchiectasis by chest x-ray. She will be on amoxicillin 500 twice a day. Her  regular medications were refilled. She is interested in following up with Dr. Nyoka Cowden. She is on Xanax and Fioricet  and has been compliant with these medications. I did give her a copy of the [facility] drug prescription program.  Gross sideeffects, risk and benefits, and alternatives of medications d/w patient. Patient is aware that all medications have potential sideeffects and we are unable to predict every sideeffect or drug-drug interaction that may occur.  Arlyss Queen MD 02/18/2016 12:53 PM

## 2016-05-15 ENCOUNTER — Other Ambulatory Visit: Payer: Self-pay | Admitting: Emergency Medicine

## 2016-05-16 NOTE — Telephone Encounter (Signed)
Ascomp-codeine and Xanax approved for one month; no refills; please call into pharmacy. Then, call patient; she must establish with a new provider to receive further refills. Please schedule OV with Dr. Carlota Raspberry (per last note with Daub) or one of our new providers at 104.

## 2016-05-17 NOTE — Telephone Encounter (Signed)
LMOVM per Dr Thompson Caul message. Pt  Called and I spoke with her.

## 2016-06-13 ENCOUNTER — Ambulatory Visit (INDEPENDENT_AMBULATORY_CARE_PROVIDER_SITE_OTHER): Payer: BLUE CROSS/BLUE SHIELD | Admitting: Family Medicine

## 2016-06-13 VITALS — BP 122/72 | HR 76 | Temp 98.5°F | Resp 17 | Ht <= 58 in | Wt 95.0 lb

## 2016-06-13 DIAGNOSIS — J209 Acute bronchitis, unspecified: Secondary | ICD-10-CM | POA: Diagnosis not present

## 2016-06-13 MED ORDER — AMOXICILLIN 500 MG PO CAPS: 1000.0000 mg | ORAL_CAPSULE | Freq: Two times a day (BID) | ORAL | 0 refills | Status: DC

## 2016-06-13 MED ORDER — PAROXETINE HCL 10 MG PO TABS: 10.0000 mg | ORAL_TABLET | Freq: Every day | ORAL | 0 refills | Status: AC

## 2016-06-13 MED ORDER — ALPRAZOLAM 0.25 MG PO TABS: 0.2500 mg | ORAL_TABLET | Freq: Every day | ORAL | 0 refills | Status: DC | PRN

## 2016-06-13 MED ORDER — IPRATROPIUM BROMIDE 0.03 % NA SOLN: 2.0000 | Freq: Two times a day (BID) | NASAL | 0 refills | Status: AC

## 2016-06-13 MED ORDER — KETOROLAC TROMETHAMINE 0.5 % OP SOLN: 1.0000 [drp] | Freq: Two times a day (BID) | OPHTHALMIC | 0 refills | Status: AC

## 2016-06-13 MED ORDER — KETOROLAC TROMETHAMINE 0.5 % OP SOLN: 1.0000 [drp] | Freq: Two times a day (BID) | OPHTHALMIC | 0 refills | Status: DC

## 2016-06-13 MED ORDER — PAROXETINE HCL 10 MG PO TABS: 10.0000 mg | ORAL_TABLET | Freq: Every day | ORAL | 0 refills | Status: DC

## 2016-06-13 NOTE — Patient Instructions (Addendum)
I have refilled your Paxil and Xanax prescriptions for 15 days. Please schedule an appointment to establish care and medication refills with Dr. Nyoka Cowden.  Start Amoxicillin 1000 mg twice daily for 10 days.   Ipratropium (Atrovent) 2 sprays, twice daily.   IF you received an x-ray today, you will receive an invoice from [facility]. Please contact [facility] at [phone] with questions or concerns regarding your invoice.   IF you received labwork today, you will receive an invoice from [facility]. Please contact [facility] at [phone] with questions or concerns regarding your invoice.   Our billing staff will not be able to assist you with questions regarding bills from these companies.  You will be contacted with the lab results as soon as they are available. The fastest way to get your results is to activate your My Chart account. Instructions are located on the last page of this paperwork. If you have not heard from [place] regarding the results in 2 weeks, please contact this office.     Acute Bronchitis, Adult Acute bronchitis is when air tubes (bronchi) in the lungs suddenly get swollen. The condition can make it hard to breathe. It can also cause these symptoms:  A cough.  Coughing up clear, yellow, or green mucus.  Wheezing.  Chest congestion.  Shortness of breath.  A fever.  Body aches.  Chills.  A sore throat. Follow these instructions at home: Medicines  Take over-the-counter and prescription medicines only as told by your doctor.  If you were prescribed an antibiotic medicine, take it as told by your doctor. Do not stop taking the antibiotic even if you start to feel better. General instructions  Rest.  Drink enough fluids to keep your pee (urine) clear or pale yellow.  Avoid smoking and secondhand smoke. If you smoke and you need help quitting, ask your doctor. Quitting will help your lungs heal faster.  Use an inhaler, cool mist  vaporizer, or humidifier as told by your doctor.  Keep all follow-up visits as told by your doctor. This is important. How is this prevented? To lower your risk of getting this condition again:  Wash your hands often with soap and water. If you cannot use soap and water, use hand sanitizer.  Avoid contact with people who have cold symptoms.  Try not to touch your hands to your mouth, nose, or eyes.  Make sure to get the flu shot every year. Contact a doctor if:  Your symptoms do not get better in 2 weeks. Get help right away if:  You cough up blood.  You have chest pain.  You have very bad shortness of breath.  You become dehydrated.  You faint (pass out) or keep feeling like you are going to pass out.  You keep throwing up (vomiting).  You have a very bad headache.  Your fever or chills gets worse. This information is not intended to replace advice given to you by your health care provider. Make sure you discuss any questions you have with your health care provider. Document Released: 11/08/2007 Document Revised: 12/29/2015 Document Reviewed: 11/10/2015 Elsevier Interactive Patient Education  2017 [employer].

## 2016-06-13 NOTE — Progress Notes (Signed)
Patient ID: Christina Hinton, female    DOB: [birth year], 61 y.o.   MRN: [id number]  PCP: Jenny Reichmann, MD  Chief Complaint  Patient presents with  . URI    Subjective:  HPI 61 year old female, current daily smoker, presents for evaluation of upper respiratory infection. Chronic medical problems include COPD, GAD, Migraines. Reports cough with head congestion and bilateral headache x 3-4 days. Coughing, productive intermittently with whitish phlegm. Soreness of throat onset today. She takes Qvar chronically for COPD.  Reports using her rescue inhaler twice yesterday for symptoms of shortness of breath.   She also has taken Dayquil and Mucinex with minimal relief of symptoms. Reports a subjective fever on Sunday.  Social History   Social History  . Marital status: Single    Spouse name: N/A  . Number of children: N/A  . Years of education: N/A   Occupational History  . Not on file.   Social History Main Topics  . Smoking status: Current Every Day Smoker    Packs/day: 1.00    Years: 30.00    Types: Cigarettes  . Smokeless tobacco: Not on file  . Alcohol use No  . Drug use: No  . Sexual activity: Yes   Other Topics Concern  . Not on file   Social History Narrative  . No narrative on file    Family History  Problem Relation Age of Onset  . Hyperlipidemia Mother   . Stroke Maternal Grandmother   . Cancer Maternal Grandfather   . Diabetes Paternal Grandmother    Review of Systems See HPI  Patient Active Problem List   Diagnosis Date Noted  . Abnormal CT of the chest 11/03/2015  . COPD (chronic obstructive pulmonary disease) ([place]) 11/03/2015  . Cataract 08/21/2014  . Migraine with aura and without status migrainosus, not intractable 08/21/2014  . Generalized anxiety disorder 08/21/2014   No Known Allergies  Prior to Admission medications   Medication Sig Start Date End Date Taking? Authorizing Provider  albuterol (PROVENTIL HFA;VENTOLIN HFA) 108 (90  Base) MCG/ACT inhaler Inhale 2 puffs into the lungs every 4 (four) hours as needed for wheezing or shortness of breath (cough, shortness of breath or wheezing.). 02/18/16  Yes [name], MD  ALPRAZolam Duanne Moron) 0.25 MG tablet TAKE 1 TABLET BY MOUTH EVERY DAY AS NEEDED 05/16/16  Yes Wardell Honour, MD  amoxicillin (AMOXIL) 500 MG capsule Take 1 capsule (500 mg total) by mouth 2 (two) times daily. 02/18/16  Yes [name], MD  ASCOMP-CODEINE [phone] MG capsule TAKE ONE CAPSULE BY MOUTH TWICE A DAY AS NEEDED FOR PAIN 05/16/16  Yes Wardell Honour, MD  beclomethasone (QVAR) 80 MCG/ACT inhaler Use 1 puff twice a day followed by rinsing your mouth out with water. 02/18/16  Yes [name], MD  gabapentin (NEURONTIN) 100 MG capsule Take 1-2 capsules 3 times a day. Caution for drowsiness. 08/25/15  Yes [name], MD  ketorolac (ACULAR) 0.5 % ophthalmic solution Place 1 drop into the right eye 2 (two) times daily. 02/18/16  Yes [name], MD  methylphenidate (RITALIN) 10 MG tablet Take 10 mg by mouth daily as needed. 07/30/15  Yes Historical Provider, MD  methylphenidate (RITALIN) 20 MG tablet Take 20 mg by mouth daily as needed. 07/30/15  Yes Historical Provider, MD  Multiple Vitamin (MULTIVITAMIN) capsule Take 1 capsule by mouth daily.   Yes Historical Provider, MD  PARoxetine (PAXIL) 10 MG tablet Take 1 tablet (10  mg total) by mouth daily. 02/18/16  Yes [name], MD  ranitidine (ZANTAC) 150 MG capsule Take 150 mg by mouth 2 (two) times daily.   Yes Historical Provider, MD    Past Medical, Surgical Family and Social History reviewed and updated.    Objective:   Today's Vitals   06/13/16 1112  BP: 122/72  Pulse: 76  Resp: 17  Temp: 98.5 F (36.9 C)  TempSrc: Oral  SpO2: 96%  Weight: 95 lb (43.1 kg)  Height: 4' (1.219 m)   Wt Readings from Last 3 Encounters:  06/13/16 95 lb (43.1 kg)  02/18/16 94 lb 12.8 oz (43 kg)  11/03/15 97 lb 12.8 oz (44.4 kg)   Physical Exam    Constitutional: She is oriented to person, place, and time. She appears well-developed and well-nourished.  HENT:  Head: Normocephalic.  Right Ear: Hearing, tympanic membrane, external ear and ear canal normal.  Left Ear: Hearing, tympanic membrane, external ear and ear canal normal.  Nose: Mucosal edema, rhinorrhea and sinus tenderness present.  Mouth/Throat: No oropharyngeal exudate, posterior oropharyngeal edema or posterior oropharyngeal erythema.  Eyes: Conjunctivae are normal. Pupils are equal, round, and reactive to light.  Cardiovascular: Normal rate, regular rhythm, normal heart sounds and intact distal pulses.   Pulmonary/Chest: Effort normal and breath sounds normal.  Musculoskeletal: Normal range of motion.  Lymphadenopathy:    She has no cervical adenopathy.  Neurological: She is alert and oriented to person, place, and time. She has normal reflexes.  Psychiatric: She has a normal mood and affect. Her behavior is normal. Judgment and thought content normal.        Assessment & Plan:  1. Acute bronchitis, unspecified organism  Plan:  -Amoxicillin 1,000 mg, twice daily x 10 days. -Ipratropium  (Atrovent) 0.03% nasal spray  -Patient had requested refills on all medication at the end of visit. I advised her that she needed to establish care with Dr. Nyoka Cowden and schedule a follow-up in order to review medications, indications for medications, an obtain refills. I agreed to refill Xanax 15 days and Ketorolac 0.5 % opthalmic solution, 1 drop, 2 times daily until she can obtain an appointment to see Dr. Carlota Raspberry.   Carroll Sage. Kenton Kingfisher, MSN, FNP-C [facility] [place]

## 2016-06-15 ENCOUNTER — Encounter: Payer: Self-pay | Admitting: Family Medicine

## 2016-06-15 ENCOUNTER — Ambulatory Visit (INDEPENDENT_AMBULATORY_CARE_PROVIDER_SITE_OTHER): Payer: BLUE CROSS/BLUE SHIELD | Admitting: Family Medicine

## 2016-06-15 VITALS — BP 110/71 | HR 91 | Temp 98.6°F | Resp 16 | Ht <= 58 in | Wt 93.2 lb

## 2016-06-15 DIAGNOSIS — H269 Unspecified cataract: Secondary | ICD-10-CM

## 2016-06-15 DIAGNOSIS — G43909 Migraine, unspecified, not intractable, without status migrainosus: Secondary | ICD-10-CM | POA: Diagnosis not present

## 2016-06-15 DIAGNOSIS — F411 Generalized anxiety disorder: Secondary | ICD-10-CM | POA: Diagnosis not present

## 2016-06-15 MED ORDER — ALPRAZOLAM 0.25 MG PO TABS: 0.2500 mg | ORAL_TABLET | Freq: Every day | ORAL | 0 refills | Status: DC | PRN

## 2016-06-15 MED ORDER — BUTALBITAL-ASA-CAFF-CODEINE 50-325-40-30 MG PO CAPS: ORAL_CAPSULE | ORAL | 0 refills | Status: DC

## 2016-06-15 NOTE — Patient Instructions (Addendum)
I would recommend increasing dose of paxil or meeting with therapist to lessen need for xanax. You can also discuss this with Dr. Radene Knee, but follow up in next 2 months to discuss this medicine further.   I usually do not use Fioricet for daily migraine treatment. As we discussed there are risks with that class of medicines, especially with using xanax, and there may be safer options. I would recommend meeting with headache specialist to discuss options. I have placed that referral, but refilled previous medication for now. Continue once or twice per day if needed.   I referred you to Dr. Gershon Crane.  Let me know if refill of eyedrop needed prior to that appointment.     IF you received an x-ray today, you will receive an invoice from [facility]. Please contact [facility] at [phone] with questions or concerns regarding your invoice.   IF you received labwork today, you will receive an invoice from [facility]. Please contact [facility] at [phone] with questions or concerns regarding your invoice.   Our billing staff will not be able to assist you with questions regarding bills from these companies.  You will be contacted with the lab results as soon as they are available. The fastest way to get your results is to activate your My Chart account. Instructions are located on the last page of this paperwork. If you have not heard from [place] regarding the results in 2 weeks, please contact this office.

## 2016-06-15 NOTE — Progress Notes (Signed)
By signing my name below I, Tereasa Coop, attest that this documentation has been prepared under the direction and in the presence of Wendie Agreste, MD. Electonically Signed. Tereasa Coop, Scribe 06/15/2016 at 6:24 PM  Subjective:    Patient ID: Christina Hinton, female    DOB: 08/11/1955, 61 y.o.   MRN: [id number]  Chief Complaint  Patient presents with  . Medication Refill    xanax,butalbital, and ketorolac  . Referral    for her eyes    HPI Christina Hinton is a 61 y.o. female who presents to the [facility] to establish care with Dr Carlota Raspberry. Previous pt of Dr Everlene Farrier. History of emphysema, migraine, generalized anxiety, cataracts, and abnormal chest CT. Note that pt was seen 2 days ago for prescription refills and was given small prescriptions and recommended that she follow up with Dr Carlota Raspberry to establish care.   Emphysema w/ history of abnormal chest CT Most recent chest CT in July 2017 showed resolved medial lobe consolidation with minimal scarring and underlying bronchiectasis as well as emphysema. Also noted coronary artery calcification. Pt was referred to cardiology.  Migraine HAs Pt has previously been managed by Dr Everlene Farrier with Fioricet with codeine, 1 tab every morning, then a second tab in the evening if she needs it. Denies ever taking more than 2 a day. Takes additional Fioricet 5 days out of a week. Last prescription for # 60 on 05/16/16. Pt reports that she has a sharp stabbing HA behind her right eye if she does not take her Fioricet for 2 days. Pt denies HA today. Pt reports strong family history of Migraines.   Generalized Anxiety Pt has been treated with 0.25 mg xanax daily as needed with #15 prescribed by Molli Barrows on 06/13/16. Pt still has the paper prescription which she has not filled yet. Pt states she is using the xanax at least once a day and occasionally BID. Pt also takes '10mg'$  Paxil QD. Pt states she was given a prescription for Paxil given 2  days ago because she has plenty of Paxil at home. Pt denies any recent appointments with counseling. Pt states that she has also been prescribed paxil by her OB/gyn, Dr Radene Knee.   Cervical Radiculopathy  Pt has been treated with gabapentin for cervical radiculopathy in March 2017. MRI of cervical spine was ordered but not performed.   Cataracts Pt had 2 cataracts removed from rt eye and has inflammation on her retina. Pt has been using Ketoralac eye drops for rt eye due to the inflammation. Pt had a prescription refill at visit 2 days ago. Uses 1 drop BID in rt eye. Cataract removal was performed 10 years ago. Pt has not been seen by the doctor who performed the cataract removal surgery since that time. Pt requesting eye doctor referral at this time. Has not seen an eye doctor in 3 years. Dr Gershon Crane was her most recent eye doctor. Pt would like to go back to Dr Gershon Crane.    Patient Active Problem List   Diagnosis Date Noted  . Abnormal CT of the chest 11/03/2015  . COPD (chronic obstructive pulmonary disease) ([place]) 11/03/2015  . Cataract 08/21/2014  . Migraine with aura and without status migrainosus, not intractable 08/21/2014  . Generalized anxiety disorder 08/21/2014   Past Medical History:  Diagnosis Date  . Anxiety   . Arthritis   . Cataract   . GERD (gastroesophageal reflux disease)    Dr Amedeo Plenty is her  GI specialist  . 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 Osteoporosis   . Ovarian cyst    right , Dr Radene Knee is her ob.gyn ( she gets pap, mammo, pelvic and bone density scan at [facility])  . Retinal defect    swelling, Dr Zadie Rhine    Past Surgical History:  Procedure Laterality Date  . CESAREAN SECTION    . EYE SURGERY     right side x 2, Dr Gershon Crane   No Known Allergies Prior to Admission medications   Medication Sig Start Date End Date Taking? Authorizing Provider  albuterol  (PROVENTIL HFA;VENTOLIN HFA) 108 (90 Base) MCG/ACT inhaler Inhale 2 puffs into the lungs every 4 (four) hours as needed for wheezing or shortness of breath (cough, shortness of breath or wheezing.). 02/18/16   [name], MD  ALPRAZolam Duanne Moron) 0.25 MG tablet Take 1 tablet (0.25 mg total) by mouth daily as needed. 06/13/16   Sedalia Muta, FNP  amoxicillin (AMOXIL) 500 MG capsule Take 2 capsules (1,000 mg total) by mouth 2 (two) times daily. 06/13/16   Sedalia Muta, FNP  ASCOMP-CODEINE [phone] MG capsule TAKE ONE CAPSULE BY MOUTH TWICE A DAY AS NEEDED FOR PAIN 05/16/16   Wardell Honour, MD  beclomethasone (QVAR) 80 MCG/ACT inhaler Use 1 puff twice a day followed by rinsing your mouth out with water. 02/18/16   [name], MD  gabapentin (NEURONTIN) 100 MG capsule Take 1-2 capsules 3 times a day. Caution for drowsiness. 08/25/15   [name], MD  ipratropium (ATROVENT) 0.03 % nasal spray Place 2 sprays into both nostrils 2 (two) times daily. 06/13/16   Sedalia Muta, FNP  ketorolac (ACULAR) 0.5 % ophthalmic solution Place 1 drop into the right eye 2 (two) times daily. 06/13/16   Sedalia Muta, FNP  methylphenidate (RITALIN) 10 MG tablet Take 10 mg by mouth daily as needed. 07/30/15   Historical Provider, MD  methylphenidate (RITALIN) 20 MG tablet Take 20 mg by mouth daily as needed. 07/30/15   Historical Provider, MD  Multiple Vitamin (MULTIVITAMIN) capsule Take 1 capsule by mouth daily.    Historical Provider, MD  PARoxetine (PAXIL) 10 MG tablet Take 1 tablet (10 mg total) by mouth daily. 06/13/16   Sedalia Muta, FNP  ranitidine (ZANTAC) 150 MG capsule Take 150 mg by mouth 2 (two) times daily.    Historical Provider, MD   Social History   Social History  . Marital status: Single    Spouse name: N/A  . Number of children: N/A  . Years of education: N/A   Occupational History  . Not on file.   Social History Main Topics  . Smoking  status: Current Every Day Smoker    Packs/day: 1.00    Years: 30.00    Types: Cigarettes  . Smokeless tobacco: Not on file  . Alcohol use No  . Drug use: No  . Sexual activity: Yes   Other Topics Concern  . Not on file   Social History Narrative  . No narrative on file      Review of Systems  Constitutional: Negative for fatigue and unexpected weight change.  Respiratory: Negative for chest tightness and shortness of breath.   Cardiovascular: Negative for chest pain, palpitations and leg swelling.  Gastrointestinal: Negative for abdominal pain and blood in stool.  Neurological:  Negative for dizziness, syncope, light-headedness and headaches.       Objective:   Physical Exam  Constitutional: She is oriented to person, place, and time. She appears well-developed and well-nourished.  HENT:  Head: Normocephalic and atraumatic.  Eyes: Conjunctivae and EOM are normal. Pupils are equal, round, and reactive to light.  Neck: Carotid bruit is not present.  Cardiovascular: Normal rate, regular rhythm, normal heart sounds and intact distal pulses.   Pulmonary/Chest: Effort normal and breath sounds normal.  Abdominal: Soft. She exhibits no pulsatile midline mass. There is no tenderness.  Neurological: She is alert and oriented to person, place, and time. No cranial nerve deficit. She displays a negative Romberg sign.  Skin: Skin is warm and dry.  Psychiatric: She has a normal mood and affect. Her behavior is normal.  Vitals reviewed.    Vitals:   06/15/16 1723  BP: 110/71  Pulse: 91  Resp: 16  Temp: 98.6 F (37 C)  TempSrc: Oral  SpO2: 98%  Weight: 93 lb 3.2 oz (42.3 kg)  Height: 4' (1.219 m)         Assessment & Plan:  NOTE THAT pt brought in her paper prescription for #15 0.'25mg'$  xanax written by Molli Barrows. The prescription was shredded by staff.   Also note that the prescription for Ascomp-codeine and xanax were reprinted in office due to initial prescription  being printed on regular paper instead of prescription paper.   Christina Hinton is a 61 y.o. female Cataract of right eye, unspecified cataract type - Plan: Ambulatory referral to Ophthalmology  - Agreed to refill Acular drops, but referred to optho for continuing care, and likely can have that prescription filled by eye care provider in the future.  Migraine without status migrainosus, not intractable, unspecified migraine type - Plan: AMB referral to headache clinic, butalbital-aspirin-caffeine-codeine (ASCOMP-CODEINE) 50-325-40-30 MG capsule, DISCONTINUED: butalbital-aspirin-caffeine-codeine (ASCOMP-CODEINE) 50-325-40-30 MG capsule  - Discussed concerns with chronic use of the butalbital and codeine for management of migraine and likely other options available. Also discussed with concurrent use of Xanax would prefer her not be on narcotic and Xanax at the same time due to risk of overdose. Although she has been on this medication for some time, also discussed that it may change how it works and increased side effects as she ages. Understanding expressed  - Agreed on temporary refill for now, but referred to headache specialist to determine other options for treatment of her migraines.  Generalized anxiety disorder - Plan: ALPRAZolam (XANAX) 0.25 MG tablet, DISCONTINUED: ALPRAZolam (XANAX) 0.25 MG tablet  - Although low-dose, persistent/daily need of Xanax. Recommended either increased dose of her SSRI, or meeting with therapist for improved control of anxiety, and lessen need of BDZ. Additive risks with meds above were discussed.  She would like to discuss this with her other doctor, Dr. Radene Knee and will let me know. Follow up next few months to decide next step.   Meds ordered this encounter  Medications  . DISCONTD: butalbital-aspirin-caffeine-codeine (ASCOMP-CODEINE) 50-325-40-30 MG capsule    Sig: TAKE ONE CAPSULE BY MOUTH TWICE A DAY AS NEEDED FOR PAIN    Dispense:  60 capsule    Refill:  0      Not to exceed 5 additional fills before 08/16/2016.  Marland Kitchen DISCONTD: ALPRAZolam (XANAX) 0.25 MG tablet    Sig: Take 1 tablet (0.25 mg total) by mouth daily as needed for anxiety.    Dispense:  30 tablet    Refill:  0  .  ALPRAZolam (XANAX) 0.25 MG tablet    Sig: Take 1 tablet (0.25 mg total) by mouth daily as needed for anxiety.    Dispense:  30 tablet    Refill:  0  . butalbital-aspirin-caffeine-codeine (ASCOMP-CODEINE) 50-325-40-30 MG capsule    Sig: TAKE ONE CAPSULE BY MOUTH TWICE A DAY AS NEEDED FOR PAIN    Dispense:  60 capsule    Refill:  0    Not to exceed 5 additional fills before 08/16/2016.   Patient Instructions   I would recommend increasing dose of paxil or meeting with therapist to lessen need for xanax. You can also discuss this with Dr. Radene Knee, but follow up in next 2 months to discuss this medicine further.   I usually do not use Fioricet for daily migraine treatment. As we discussed there are risks with that class of medicines, especially with using xanax, and there may be safer options. I would recommend meeting with headache specialist to discuss options. I have placed that referral, but refilled previous medication for now. Continue once or twice per day if needed.   I referred you to Dr. Gershon Crane.  Let me know if refill of eyedrop needed prior to that appointment.     IF you received an x-ray today, you will receive an invoice from [facility]. Please contact [facility] at [phone] with questions or concerns regarding your invoice.   IF you received labwork today, you will receive an invoice from [place]. Please contact [facility] at [phone] with questions or concerns regarding your invoice.   Our billing staff will not be able to assist you with questions regarding bills from these companies.  You will be contacted with the lab results as soon as they are available. The fastest way to get your results is to activate your My Chart account.  Instructions are located on the last page of this paperwork. If you have not heard from [place] regarding the results in 2 weeks, please contact this office.        I personally performed the services described in this documentation, which was scribed in my presence. The recorded information has been reviewed and considered, and addended by me as needed.   Signed,   Merri Ray, MD [facility] [place].  06/18/16 1:38 PM

## 2016-07-14 ENCOUNTER — Other Ambulatory Visit: Payer: Self-pay | Admitting: Family Medicine

## 2016-07-14 DIAGNOSIS — G43909 Migraine, unspecified, not intractable, without status migrainosus: Secondary | ICD-10-CM

## 2016-07-15 NOTE — Telephone Encounter (Signed)
06/15/16 last refill and ov

## 2016-07-16 NOTE — Telephone Encounter (Signed)
According to the last OV note this patient was seen, evaluated, and establish care with Dr. Merri Ray. He is the person that prescribed the Ascomp- Codeine. Please forward patient's refill request to Dr. Carlota Raspberry.  Carroll Sage. Kenton Kingfisher, MSN, FNP-C [facility] [place]

## 2016-07-17 MED ORDER — BUTALBITAL-ASA-CAFF-CODEINE 50-325-40-30 MG PO CAPS: ORAL_CAPSULE | ORAL | 0 refills | Status: DC

## 2016-07-17 NOTE — Telephone Encounter (Signed)
optho appt scheduled 08/16/16.  Refilled for 1 more month.

## 2016-07-17 NOTE — Telephone Encounter (Signed)
Faxed to [facility] [address]

## 2016-08-05 ENCOUNTER — Other Ambulatory Visit: Payer: Self-pay | Admitting: Family Medicine

## 2016-08-05 DIAGNOSIS — F411 Generalized anxiety disorder: Secondary | ICD-10-CM

## 2016-08-05 NOTE — Telephone Encounter (Signed)
Called to pharm and f/u info given

## 2016-08-05 NOTE — Telephone Encounter (Signed)
06/15/16 last ov and refill

## 2016-08-05 NOTE — Telephone Encounter (Signed)
Refilled, but schedule office visit prior to this prescription running out, as planned for 2 month follow-up when discussed in January.

## 2016-08-15 ENCOUNTER — Other Ambulatory Visit: Payer: Self-pay | Admitting: Family Medicine

## 2016-08-15 DIAGNOSIS — G43909 Migraine, unspecified, not intractable, without status migrainosus: Secondary | ICD-10-CM

## 2016-08-16 ENCOUNTER — Ambulatory Visit: Payer: Self-pay | Admitting: Neurology

## 2016-08-16 NOTE — Telephone Encounter (Signed)
Based on referral notes, should have appointment with neurology today to discuss headaches and medication changes. I would like to know the plan from neurology/headache specialist prior to refilling this medication.

## 2016-08-17 NOTE — Telephone Encounter (Signed)
Presumably, you will receive the note from the neurologist once it is complete.  When you do, please either authorize or decline the prescription, based on the new plan, and close the encounter.

## 2016-08-17 NOTE — Telephone Encounter (Signed)
appt cancelled with neuro yesterday by patient. I refilled med once, but will need to see neuro prior to any further refills. Please remind her to reschedule with neuro.

## 2016-08-18 NOTE — Telephone Encounter (Signed)
IC pt - LMOVM faxing rx to [facility] pt needs to follow up with neuro prior to further refills.

## 2016-08-23 ENCOUNTER — Telehealth: Payer: Self-pay

## 2016-08-23 NOTE — Telephone Encounter (Signed)
qvar not available please change to qvaredihaler

## 2016-08-24 MED ORDER — BECLOMETHASONE DIPROP HFA 80 MCG/ACT IN AERB: 1.0000 | INHALATION_SPRAY | Freq: Two times a day (BID) | RESPIRATORY_TRACT | 1 refills | Status: AC

## 2016-08-24 NOTE — Telephone Encounter (Signed)
Qvar changed to Qvar rediinhaler

## 2016-09-15 ENCOUNTER — Other Ambulatory Visit: Payer: Self-pay | Admitting: Family Medicine

## 2016-09-15 DIAGNOSIS — G43909 Migraine, unspecified, not intractable, without status migrainosus: Secondary | ICD-10-CM

## 2016-09-15 DIAGNOSIS — F411 Generalized anxiety disorder: Secondary | ICD-10-CM

## 2016-09-16 NOTE — Telephone Encounter (Signed)
Refilled once more as noted she has an appointment scheduled 5/14 with Neuro.  I will not be able to provide further refills unless she keeps her appointment as last neuro appt was cancelled.

## 2016-10-16 ENCOUNTER — Telehealth: Payer: Self-pay | Admitting: Neurology

## 2016-10-16 ENCOUNTER — Encounter: Payer: Self-pay | Admitting: Neurology

## 2016-10-16 ENCOUNTER — Ambulatory Visit: Payer: BLUE CROSS/BLUE SHIELD | Admitting: Neurology

## 2016-10-16 ENCOUNTER — Other Ambulatory Visit: Payer: Self-pay | Admitting: *Deleted

## 2016-10-16 VITALS — BP 108/60 | HR 86 | Ht <= 58 in | Wt 92.4 lb

## 2016-10-16 DIAGNOSIS — G4440 Drug-induced headache, not elsewhere classified, not intractable: Secondary | ICD-10-CM

## 2016-10-16 DIAGNOSIS — F17210 Nicotine dependence, cigarettes, uncomplicated: Secondary | ICD-10-CM | POA: Diagnosis not present

## 2016-10-16 DIAGNOSIS — G43009 Migraine without aura, not intractable, without status migrainosus: Secondary | ICD-10-CM | POA: Diagnosis not present

## 2016-10-16 DIAGNOSIS — M26609 Unspecified temporomandibular joint disorder, unspecified side: Secondary | ICD-10-CM | POA: Diagnosis not present

## 2016-10-16 MED ORDER — PREDNISONE 10 MG PO TABS: ORAL_TABLET | ORAL | 0 refills | Status: DC

## 2016-10-16 MED ORDER — BUTALBITAL-ASPIRIN-CAFFEINE 50-325-40 MG PO CAPS: 1.0000 | ORAL_CAPSULE | Freq: Two times a day (BID) | ORAL | 0 refills | Status: DC | PRN

## 2016-10-16 MED ORDER — SUMATRIPTAN SUCCINATE 100 MG PO TABS: ORAL_TABLET | ORAL | 2 refills | Status: DC

## 2016-10-16 MED ORDER — TOPIRAMATE 25 MG PO TABS: 25.0000 mg | ORAL_TABLET | Freq: Every day | ORAL | 2 refills | Status: DC

## 2016-10-16 NOTE — Telephone Encounter (Signed)
Caller: PT  Urgent? No  Reason for the call: PT left a VM message regarding her medication Sumatriptan and her Albuterol, was hard to understand but I think those are the two medications she was referring too

## 2016-10-16 NOTE — Patient Instructions (Signed)
1.  Stop Excedrin. 2.  Taper off Fiorinal  You may take for only 6 days for week 1 (this week)  You may take for only 5 days for week 2 (next week)  You may take for only 4 days for week 3  You may take for only 3 days for week 4  You may take for only 2 days for week 5  You may take for only 1 day for week 6  Then stop 3.  We will start topiramate '25mg'$  at bedtime.  Contact me in 4 weeks with update and we can increase dose if needed. 4.  When you get a headache, take sumatriptan '100mg'$  at earliest onset of headache.  May repeat dose once in 2 hours if needed. 5.  Please see somebody for your TMJ 6.  Follow up in 3 months.

## 2016-10-16 NOTE — Telephone Encounter (Signed)
We can prescribe 1 tablet twice daily as needed, #42 no refills

## 2016-10-16 NOTE — Progress Notes (Signed)
NEUROLOGY CONSULTATION NOTE  [name] MRN: [id number] DOB: 05-05-1956  Referring provider: Merri Ray, MD Primary care provider: Molli Barrows, FNP  Reason for consult:  Migraine  HISTORY OF PRESENT ILLNESS: Christina Hinton is a 61 year old female with emphysema, cataracts and generalized anxiety who presents for headaches.  History supplemented by PCP note.  Migraines: Onset:  61 years old Location:  Right retro-orbital Quality:  stabbing Intensity:  8-9/10 Aura:  no Prodrome:  no Postdrome:  no Associated symptoms:  Nausea, photophobia, phonophobia.  She has not had any new worse headache of her life, waking up from sleep Duration:  Until she takes an Excedrin or Fioricet, otherwise several hours. Frequency:  2 to 3 days per month Triggers/exacerbating factors:  unknown Relieving factors:  Excedrin, Fioricet with codeine, rest Activity:  Aggravates.  Tension-type Headaches: Starts in back of neck and radiates to top of head and temples.  They are non-throbbing 7/10 pain.  They are constant with no associated symptoms.  There are no specific triggers.  Past NSAIDS:  Ibuprofen, naproxen, ketorolac Past analgesics:  Tylenol Past abortive triptans:  no Past muscle relaxants:  Flexeril (drowsiness) Past anti-emetic:  no Past antihypertensive medications:  no Past antidepressant medications:  no Past anticonvulsant medications:  no Past vitamins/Herbal/Supplements:  no Other past therapies:  no  Current NSAIDS:  no Current analgesics:  butalbital-ASA-caffeine-codeine (daily), Excedrin (daily) Current triptans:  no Current anti-emetic:  no Current muscle relaxants:  no Current anti-anxiolytic:  alprazolam 0.'25mg'$  Current sleep aide:  no Current Antihypertensive medications:  no Current Antidepressant medications:  paroxetine '10mg'$  Current Anticonvulsant medications:  no Current Vitamins/Herbal/Supplements:  no Current Antihistamines/Decongestants:   no Other therapy:  no Other medication:  Ritalin  Caffeine:  no Alcohol:  no Smoker:  yes Diet:  hydrates Exercise:  no Depression/anxiety:  okay Sleep hygiene:  okay Family history of headache:  Mom, grandfather, son, daughter  She has history of left sided TMJ.  She was told by a dentist that she likely needs surgery.  CT head from 06/22/14 was personally reviewed and was unremarkable. Cervical X-ray from 08/25/15 was personally reviewed and showed disc space narrowing at C5-6 and C6-7 and bilateral facet joint hypertrophy with encroachment upon the neural foramina from C4 throught C6 bilaterallly.  PAST MEDICAL HISTORY: Past Medical History:  Diagnosis Date  . Anxiety   . Arthritis   . Cataract   . GERD (gastroesophageal reflux disease)    Dr Amedeo Plenty is her GI specialist  . 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 Osteoporosis   . Ovarian cyst    right , Dr Radene Knee is her ob.gyn ( she gets pap, mammo, pelvic and bone density scan at [facility])  . Retinal defect    swelling, Dr Zadie Rhine     PAST SURGICAL HISTORY: Past Surgical History:  Procedure Laterality Date  . CESAREAN SECTION    . EYE SURGERY     right side x 2, Dr Gershon Crane    MEDICATIONS: Current Outpatient Prescriptions on File Prior to Visit  Medication Sig Dispense Refill  . albuterol (PROVENTIL HFA;VENTOLIN HFA) 108 (90 Base) MCG/ACT inhaler Inhale 2 puffs into the lungs every 4 (four) hours as needed for wheezing or shortness of breath (cough, shortness of breath or wheezing.). 1 Inhaler 3  . ALPRAZolam (XANAX) 0.25 MG tablet TAKE 1 TABLET BY  MOUTH EVERY DAY AS NEEDED 30 tablet 0  . beclomethasone (QVAR) 80 MCG/ACT inhaler Use 1 puff twice a day followed by rinsing your mouth out with water. 1 Inhaler 11  . Beclomethasone Diprop HFA (QVAR REDIHALER) 80 MCG/ACT AERB Inhale 1 puff into the lungs 2 (two) times daily. 3  Inhaler 1  . ipratropium (ATROVENT) 0.03 % nasal spray Place 2 sprays into both nostrils 2 (two) times daily. 30 mL 0  . ketorolac (ACULAR) 0.5 % ophthalmic solution Place 1 drop into the right eye 2 (two) times daily. 5 mL 0  . methylphenidate (RITALIN) 10 MG tablet Take 10 mg by mouth daily as needed.  0  . methylphenidate (RITALIN) 20 MG tablet Take 20 mg by mouth daily as needed.  0  . Multiple Vitamin (MULTIVITAMIN) capsule Take 1 capsule by mouth daily.    Marland Kitchen PARoxetine (PAXIL) 10 MG tablet Take 1 tablet (10 mg total) by mouth daily. 90 tablet 0  . ranitidine (ZANTAC) 150 MG capsule Take 150 mg by mouth 2 (two) times daily.     No current facility-administered medications on file prior to visit.     ALLERGIES: No Known Allergies  FAMILY HISTORY: Family History  Problem Relation Age of Onset  . Hyperlipidemia Mother   . Stroke Maternal Grandmother   . Cancer Maternal Grandfather   . Diabetes Paternal Grandmother     SOCIAL HISTORY: Social History   Social History  . Marital status: Single    Spouse name: N/A  . Number of children: N/A  . Years of education: N/A   Occupational History  . Not on file.   Social History Main Topics  . Smoking status: Current Every Day Smoker    Packs/day: 1.00    Years: 30.00    Types: Cigarettes  . Smokeless tobacco: Never Used  . Alcohol use No  . Drug use: No  . Sexual activity: Yes   Other Topics Concern  . Not on file   Social History Narrative  . No narrative on file    REVIEW OF SYSTEMS: Constitutional: No fevers, chills, or sweats, no generalized fatigue, change in appetite Eyes: No visual changes, double vision, eye pain Ear, nose and throat: No hearing loss, ear pain, nasal congestion, sore throat Cardiovascular: No chest pain, palpitations Respiratory:  No shortness of breath at rest or with exertion, wheezes GastrointestinaI: No nausea, vomiting, diarrhea, abdominal pain, fecal incontinence Genitourinary:  No  dysuria, urinary retention or frequency Musculoskeletal:  No neck pain, back pain Integumentary: No rash, pruritus, skin lesions Neurological: as above Psychiatric: No depression, insomnia, anxiety Endocrine: No palpitations, fatigue, diaphoresis, mood swings, change in appetite, change in weight, increased thirst Hematologic/Lymphatic:  No purpura, petechiae. Allergic/Immunologic: no itchy/runny eyes, nasal congestion, recent allergic reactions, rashes  PHYSICAL EXAM: Vitals:   10/16/16 1113  BP: 108/60  Pulse: 86   General: No acute distress.  Patient appears well-groomed.  Head:  Normocephalic/atraumatic, tenderness and crepitus notes with palpation of left TMJ. Eyes:  fundi examined but not visualized Neck: supple, bilateral suboccipital and posterior neck tenderness, full range of motion Back: No paraspinal tenderness Heart: regular rate and rhythm Lungs: Clear to auscultation bilaterally. Vascular: No carotid bruits. Neurological Exam: Mental status: alert and oriented to person, place, and time, recent and remote memory intact, fund of knowledge intact, attention and concentration intact, speech fluent and not dysarthric, language intact. Cranial nerves: CN I: not tested CN II: pupils equal, round and reactive to light, visual fields intact  CN III, IV, VI:  full range of motion, no nystagmus, no ptosis CN V: facial sensation intact CN VII: upper and lower face symmetric CN VIII: hearing intact CN IX, X: gag intact, uvula midline CN XI: sternocleidomastoid and trapezius muscles intact CN XII: tongue midline Bulk & Tone: normal, no fasciculations. Motor:  5/5 throughout  Sensation: temperature and vibration sensation intact. Deep Tendon Reflexes:  2+ throughout, toes downgoing.  Finger to nose testing:  Without dysmetria.  Heel to shin:  Without dysmetria.  Gait:  Normal station and stride.  Able to turn and tandem walk. Romberg negative.  IMPRESSION: 1.  Migraine  without aura 2.  Tension type headache and medication overuse headache, also complicated by neck pain and TMJ 3.  Smoker  PLAN: 1.  Prednisone taper to break headache while I have her stop Excedrin and taper off Fioricet with codeine 2.  Topiramate '25mg'$  at bedtime, contact me in 4 weeks with update and we can increase dose if needed 3.  Sumatriptan '100mg'$  for abortive therapy 4.  Lifestyle modification:  Exercise, hydration, sleep hygiene, smoking cessation 5.  Follow up in 3 months.  Thank you for allowing me to take part in the care of this patient.  Metta Clines, DO  CC:  Merri Ray, MD  Scot Jun, FNP

## 2016-10-16 NOTE — Telephone Encounter (Signed)
Patient says that she needs more fiorinal called in if you are tapering her off of it.  Please advise.

## 2016-10-16 NOTE — Telephone Encounter (Signed)
Rx sent 

## 2016-10-21 ENCOUNTER — Other Ambulatory Visit: Payer: Self-pay | Admitting: Neurology

## 2016-10-21 ENCOUNTER — Other Ambulatory Visit: Payer: Self-pay | Admitting: Family Medicine

## 2016-10-21 DIAGNOSIS — F411 Generalized anxiety disorder: Secondary | ICD-10-CM

## 2016-10-22 NOTE — Telephone Encounter (Signed)
Advised follow-up office visit was needed at March refill request. Will provide #15 tablets, please schedule for office visit with me to discuss this medication

## 2016-10-23 NOTE — Telephone Encounter (Signed)
Called to [facility] and advised to make an appt

## 2016-11-29 ENCOUNTER — Other Ambulatory Visit: Payer: Self-pay | Admitting: Neurology

## 2017-08-13 ENCOUNTER — Other Ambulatory Visit: Payer: Self-pay | Admitting: Obstetrics and Gynecology

## 2017-08-13 DIAGNOSIS — Z87891 Personal history of nicotine dependence: Secondary | ICD-10-CM

## 2017-08-24 ENCOUNTER — Ambulatory Visit
Admission: RE | Admit: 2017-08-24 | Discharge: 2017-08-24 | Disposition: A | Discharge status: Home / Self Care | Payer: BC Managed Care – PPO | Source: Ambulatory Visit | Attending: Obstetrics and Gynecology | Admitting: Obstetrics and Gynecology

## 2017-08-24 DIAGNOSIS — Z87891 Personal history of nicotine dependence: Secondary | ICD-10-CM

## 2017-10-18 ENCOUNTER — Encounter (INDEPENDENT_AMBULATORY_CARE_PROVIDER_SITE_OTHER): Payer: Self-pay

## 2017-10-18 ENCOUNTER — Ambulatory Visit: Payer: BC Managed Care – PPO | Admitting: Interventional Cardiology

## 2017-10-18 ENCOUNTER — Encounter: Payer: Self-pay | Admitting: Interventional Cardiology

## 2017-10-18 VITALS — BP 100/78 | HR 72 | Ht <= 58 in | Wt 90.0 lb

## 2017-10-18 DIAGNOSIS — I2510 Atherosclerotic heart disease of native coronary artery without angina pectoris: Secondary | ICD-10-CM

## 2017-10-18 DIAGNOSIS — J449 Chronic obstructive pulmonary disease, unspecified: Secondary | ICD-10-CM | POA: Diagnosis not present

## 2017-10-18 DIAGNOSIS — Z720 Tobacco use: Secondary | ICD-10-CM

## 2017-10-18 NOTE — Progress Notes (Signed)
Cardiology Office Note   Date:  10/18/2017   ID:  JESSIECA RHEM, DOB 05-30-1956, MRN [id number]  PCP:  Glenford Bayley, DO    No chief complaint on file.  Coronary calcium noted on CT scan  Wt Readings from Last 3 Encounters:  10/18/17 90 lb (40.8 kg)  10/16/16 92 lb 6.4 oz (41.9 kg)  06/15/16 93 lb 3.2 oz (42.3 kg)       History of Present Illness: Christina Hinton is a 62 y.o. female who is being seen today for the evaluation of coronary calcium at the request of Wendie Agreste, MD.    The patient underwent a screening chest CT for lung cancer due to years of smoking.  Cardiac findings were as follows: "Cardiovascular: Heart size is normal. There is no significant pericardial fluid, thickening or pericardial calcification. There is aortic atherosclerosis, as well as atherosclerosis of the great vessels of the mediastinum and the coronary arteries, including calcified atherosclerotic plaque in the left main coronary artery."  She is here for further evaluation.    Denies : Chest pain. Dizziness. Leg edema. Nitroglycerin use. Orthopnea. Palpitations. Paroxysmal nocturnal dyspnea. Shortness of breath. Syncope.   No regular exercise.  Worse in the winter.  In the summer, she walks 2-3 miles a day several days a week.  [place] beach, [place] walks more.  She still smokes.  She has not tried anything to help stop smoking.    She is confident in her heart since her mother is in her 90s and healthy, and smoked for many years.      Past Medical History:  Diagnosis Date  . Anxiety   . Arthritis   . Cataract   . GERD (gastroesophageal reflux disease)    Dr Amedeo Plenty is her GI specialist  . 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 Osteoporosis   . Ovarian cyst    right , Dr Radene Knee is her ob.gyn ( she gets pap, mammo, pelvic and bone density scan at [facility])  . Retinal defect    swelling, Dr Zadie Rhine     Past Surgical History:  Procedure Laterality Date  . CESAREAN SECTION    . EYE SURGERY     right side x 2, Dr Gershon Crane     Current Outpatient Medications  Medication Sig Dispense Refill  . acetaminophen-codeine (TYLENOL #3) 300-30 MG tablet Take 1 tablet by mouth every 6 (six) hours.    Marland Kitchen albuterol (PROVENTIL HFA;VENTOLIN HFA) 108 (90 Base) MCG/ACT inhaler Inhale 2 puffs into the lungs every 4 (four) hours as needed for wheezing or shortness of breath (cough, shortness of breath or wheezing.). 1 Inhaler 3  . ALPRAZolam (XANAX) 0.25 MG tablet TAKE 1 TABLET BY MOUTH EVERY DAY AS NEEDED 15 tablet 0  . amoxicillin (AMOXIL) 500 MG capsule Take 500 mg by mouth 3 (three) times daily.    . Beclomethasone Diprop HFA (QVAR REDIHALER) 80 MCG/ACT AERB Inhale 1 puff into the lungs 2 (two) times daily. 3 Inhaler 1  . butalbital-aspirin-caffeine (FIORINAL) 50-325-40 MG capsule Take 1 capsule by mouth 2 (two) times daily as needed for headache. 42 capsule 0  . chlorhexidine (PERIDEX) 0.12 % solution SWISH AND SPIT 1/2 OZ TWICE A DAY  0  . ipratropium (ATROVENT) 0.03 % nasal spray Place 2 sprays into both nostrils 2 (two) times daily.  30 mL 0  . ketorolac (ACULAR) 0.5 % ophthalmic solution Place 1 drop into the right eye 2 (two) times daily. 5 mL 0  . meloxicam (MOBIC) 15 MG tablet TAKE 1/2 TABLET BY MOUTH WEEKLY  1  . methylphenidate (RITALIN) 10 MG tablet Take 10 mg by mouth daily as needed.  0  . methylphenidate (RITALIN) 20 MG tablet Take 20 mg by mouth daily as needed.  0  . Multiple Vitamin (MULTIVITAMIN) capsule Take 1 capsule by mouth daily.    Marland Kitchen PARoxetine (PAXIL) 10 MG tablet Take 1 tablet (10 mg total) by mouth daily. 90 tablet 0  . PREMARIN vaginal cream Apply 1 application topically once a week.  6  . ranitidine (ZANTAC) 150 MG capsule Take 150 mg by mouth 2 (two) times daily.     No current facility-administered medications for this visit.     Allergies:   Patient  has no known allergies.    Social History:  The patient  reports that she has been smoking cigarettes.  She has a 30.00 pack-year smoking history. She has never used smokeless tobacco. She reports that she does not drink alcohol or use drugs.   Family History:  The patient's family history includes Cancer in her maternal grandfather; Diabetes in her paternal grandmother; Hyperlipidemia in her mother; Stroke in her maternal grandmother.    ROS:  Please see the history of present illness.   Otherwise, review of systems are positive for inability to stop smoking.   All other systems are reviewed and negative.    PHYSICAL EXAM: VS:  BP 100/78 (BP Location: Right Arm, Patient Position: Sitting, Cuff Size: Normal)   Pulse 72   Ht 4\' 9"  (1.448 m)   Wt 90 lb (40.8 kg)   SpO2 97%   BMI 19.48 kg/m  , BMI Body mass index is 19.48 kg/m. GEN: Well nourished, well developed, in no acute distress  HEENT: normal  Neck: no JVD, carotid bruits, or masses Cardiac: RRR; no murmurs, rubs, or gallops,no edema  Respiratory:  clear to auscultation bilaterally, normal work of breathing GI: soft, nontender, nondistended, + BS MS: no deformity or atrophy  Skin: warm and dry, no rash Neuro:  Strength and sensation are intact Psych: euthymic mood, full affect   EKG:   The ekg ordered today demonstrates NSR, no ST changes   Recent Labs: No results found for requested labs within last 8760 hours.   Lipid Panel    Component Value Date/Time   CHOL 193 08/21/2014 0917   TRIG 71 08/21/2014 0917   HDL 98 08/21/2014 0917   CHOLHDL 2.0 08/21/2014 0917   VLDL 14 08/21/2014 0917   LDLCALC 81 08/21/2014 0917     Other studies Reviewed: Additional studies/ records that were reviewed today with results demonstrating: Chest CT reviewed.  Lipids checked and well controlled.    ASSESSMENT AND PLAN:  1. Coronary calcium : Noted on CT scan.  Given that there is plaque in the left main, will evaluate for high  risk areas of ischemia with exercise Cardiolite stress test.  She is agreeable. 2. Tobacco abuse: She continues to smoke.  It is not clear whether she is truly interested in stopping smoking.  She did state that no one has given her any aid to stop smoking.  We discussed Chantix.  She does not want to try anything where she has to stop smoking cold [place].  Will see what her stress test results show.  Chantix may be  a reasonable option to help her stop smoking. 3. At some point, she should be screened for an abdominal aortic aneurysm given her long history of smoking. 4. We spoke about healthy lifestyle options including regular exercise and smoking cessation. 5. She has had some dental issues of late.   Current medicines are reviewed at length with the patient today.  The patient concerns regarding her medicines were addressed.  The following changes have been made:  No change  Labs/ tests ordered today include:   Orders Placed This Encounter  Procedures  . MYOCARDIAL PERFUSION IMAGING  . EKG 12-Lead    Recommend 150 minutes/week of aerobic exercise Low fat, low carb, high fiber diet recommended  Disposition:   FU for stress test   Signed, Larae Grooms, MD  10/18/2017 9:41 AM    [facility] [place], [place], [place]  [address] Phone: [phone]; Fax: [phone]

## 2017-10-18 NOTE — Patient Instructions (Signed)
Medication Instructions:  Your physician recommends that you continue on your current medications as directed. Please refer to the Current Medication list given to you today.   Labwork: None ordered  Testing/Procedures: Your physician has requested that you have en exercise stress myoview. For further information please visit [email or web address]. Please follow instruction sheet, as given.  Follow-Up: Based on test results   Any Other Special Instructions Will Be Listed Below (If Applicable).     If you need a refill on your cardiac medications before your next appointment, please call your pharmacy.

## 2017-10-22 ENCOUNTER — Telehealth (HOSPITAL_COMMUNITY): Payer: Self-pay | Admitting: *Deleted

## 2017-10-22 NOTE — Telephone Encounter (Signed)
Left message on voicemail in reference to upcoming appointment scheduled for 10/25/17 on cell #. No answer or answering machine on home # Phone number given for a call back so details instructions can be given. Cambren Helm, Ranae Palms

## 2017-10-25 ENCOUNTER — Encounter (HOSPITAL_COMMUNITY): Payer: BC Managed Care – PPO

## 2018-06-20 ENCOUNTER — Other Ambulatory Visit: Payer: Self-pay | Admitting: Physician Assistant

## 2018-06-20 DIAGNOSIS — R9389 Abnormal findings on diagnostic imaging of other specified body structures: Secondary | ICD-10-CM

## 2018-06-21 ENCOUNTER — Other Ambulatory Visit: Payer: BC Managed Care – PPO

## 2018-07-02 ENCOUNTER — Other Ambulatory Visit: Payer: Self-pay | Admitting: Physician Assistant

## 2018-07-02 ENCOUNTER — Other Ambulatory Visit (HOSPITAL_COMMUNITY): Payer: Self-pay | Admitting: Physician Assistant

## 2018-07-02 DIAGNOSIS — K869 Disease of pancreas, unspecified: Secondary | ICD-10-CM

## 2018-07-02 DIAGNOSIS — N63 Unspecified lump in unspecified breast: Secondary | ICD-10-CM

## 2018-07-02 DIAGNOSIS — R918 Other nonspecific abnormal finding of lung field: Secondary | ICD-10-CM

## 2018-07-12 ENCOUNTER — Ambulatory Visit (HOSPITAL_COMMUNITY)
Admission: RE | Admit: 2018-07-12 | Discharge: 2018-07-12 | Disposition: A | Discharge status: Home / Self Care | Payer: BC Managed Care – PPO | Source: Ambulatory Visit | Attending: Physician Assistant | Admitting: Physician Assistant

## 2018-07-12 DIAGNOSIS — R918 Other nonspecific abnormal finding of lung field: Secondary | ICD-10-CM | POA: Diagnosis present

## 2018-07-12 DIAGNOSIS — K869 Disease of pancreas, unspecified: Secondary | ICD-10-CM | POA: Insufficient documentation

## 2018-07-12 DIAGNOSIS — N63 Unspecified lump in unspecified breast: Secondary | ICD-10-CM | POA: Diagnosis present

## 2018-07-12 LAB — GLUCOSE, CAPILLARY: Glucose-Capillary: 90 mg/dL (ref 70–99)

## 2018-07-12 MED ORDER — FLUDEOXYGLUCOSE F - 18 (FDG) INJECTION
5.1000 | Freq: Once | INTRAVENOUS | Status: AC
  Administered 2018-07-12: 5.1 via INTRAVENOUS

## 2018-07-17 ENCOUNTER — Other Ambulatory Visit: Payer: BC Managed Care – PPO

## 2018-07-17 ENCOUNTER — Telehealth: Payer: Self-pay | Admitting: *Deleted

## 2018-07-17 ENCOUNTER — Encounter: Payer: BC Managed Care – PPO | Admitting: Thoracic Surgery (Cardiothoracic Vascular Surgery)

## 2018-07-17 DIAGNOSIS — C799 Secondary malignant neoplasm of unspecified site: Secondary | ICD-10-CM

## 2018-07-17 NOTE — Telephone Encounter (Signed)
Oncology Nurse Navigator Documentation  Oncology Nurse Navigator Flowsheets 07/17/2018  Navigator Location [facility]  Referral date to RadOnc/MedOnc 07/16/2018  Navigator Encounter Type Telephone/I received referral on Christina Hinton yesterday.  I called and scheduled her to be seen with Dr. Julien Nordmann on 07/19/2018.  She verbalized understanding of appt time and place.   Telephone Outgoing Call  Treatment Phase Abnormal Scans  Barriers/Navigation Needs Education;Coordination of Care  Education Other  Interventions Coordination of Care;Education  Coordination of Care Appts  Education Method Verbal  Acuity Level 2  Time Spent with Patient 30

## 2018-07-19 ENCOUNTER — Telehealth: Payer: Self-pay | Admitting: Medical Oncology

## 2018-07-19 ENCOUNTER — Encounter: Payer: Self-pay | Admitting: Internal Medicine

## 2018-07-19 ENCOUNTER — Inpatient Hospital Stay: Payer: BC Managed Care – PPO | Attending: Internal Medicine | Admitting: Internal Medicine

## 2018-07-19 VITALS — BP 123/85 | HR 88 | Temp 98.7°F | Resp 18 | Ht <= 58 in | Wt 92.0 lb

## 2018-07-19 DIAGNOSIS — C3411 Malignant neoplasm of upper lobe, right bronchus or lung: Secondary | ICD-10-CM | POA: Diagnosis not present

## 2018-07-19 DIAGNOSIS — C7951 Secondary malignant neoplasm of bone: Secondary | ICD-10-CM | POA: Diagnosis not present

## 2018-07-19 DIAGNOSIS — C3490 Malignant neoplasm of unspecified part of unspecified bronchus or lung: Secondary | ICD-10-CM

## 2018-07-19 DIAGNOSIS — G893 Neoplasm related pain (acute) (chronic): Secondary | ICD-10-CM

## 2018-07-19 DIAGNOSIS — R9389 Abnormal findings on diagnostic imaging of other specified body structures: Secondary | ICD-10-CM

## 2018-07-19 DIAGNOSIS — R591 Generalized enlarged lymph nodes: Secondary | ICD-10-CM

## 2018-07-19 DIAGNOSIS — F17210 Nicotine dependence, cigarettes, uncomplicated: Secondary | ICD-10-CM | POA: Insufficient documentation

## 2018-07-19 DIAGNOSIS — C3412 Malignant neoplasm of upper lobe, left bronchus or lung: Secondary | ICD-10-CM | POA: Insufficient documentation

## 2018-07-19 DIAGNOSIS — C778 Secondary and unspecified malignant neoplasm of lymph nodes of multiple regions: Secondary | ICD-10-CM | POA: Insufficient documentation

## 2018-07-19 NOTE — Progress Notes (Signed)
[place] [phone]   [phone]  CONSULT NOTE  REFERRING PHYSICIAN: Mat Carne, PA-C  REASON FOR CONSULTATION:  63 years old white female with suspicious metastatic cancer.  HPI Christina Hinton is a 63 y.o. female with past medical history significant for anxiety, osteoarthritis, GERD, osteoporosis as well as long history of smoking.  The patient has been complaining of cough for 3-4 weeks as well as stabbing pain in the chest area.  She was seen by her primary care provider and chest x-ray was performed in January 2020 and that showed left upper lobe mass.  This was followed by CT scan of the chest on June 28, 2018 at [facility].  The scan showed abnormal soft tissue centered in the left hilar region with complete occlusion of the left upper lobe and consolidation of the entire left upper lobe.  The scan also showed multiple enlarged left supraclavicular lymph nodes the largest measured 2.7 x 1.8 cm there was also several enlarged mediastinal lymph nodes including a 2.2 x 1.5 cm right paratracheal node and a small node in the right cardiophrenic fat.  There was also right hilar adenopathy.  Scan of the upper abdomen showed a focal area of hypoattenuation within the pancreatic tail measuring 1.5 x 1.2 cm with abnormal soft tissue posterior to this between the left kidney and pancreas.  There is also suspicious 0.6 cm soft tissue nodule within the inferior right breast.  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  There was also evidence of local ipsilateral and contralateral mediastinal nodal metastasis in addition to cluster of hypermetabolic metastatic lymph nodes in the left neck involving the left supraclavicular and left level 5 lymph nodes.   There was also hypermetabolic nodal metastasis below the diaphragm in the porta hepatis and potential metastatic lesion to the pancreas.  There was multiple foci of skeletal metastasis within the lumbar and thoracic spine with multiple sites in the osseous pelvis. The patient was referred to me today for further evaluation and recommendation regarding her condition.  She also has an appointment scheduled with Dr. Roxan Hockey next week for consideration of tissue biopsy. When seen today she continues to have significant pain in the back and she was unable to sit down during the visit.  She is currently on Fioricet by her primary care physician.  The patient denied having any current chest pain, shortness of breath but continues to have productive cough of clear to whitish sputum with no significant hemoptysis.  She has no significant weight loss or night sweats.  She has no nausea, vomiting, diarrhea or constipation.  The patient has intermittent headache. Family history significant for mother with dementia and hyper lipidemia she still alive at age 49.  Father killed at age 40.  Maternal grandfather had throat cancer. The patient is single and has 2 children his son and daughter.  She was accompanied today by her boyfriend Meriel Flavors and another friend Vaughan Basta.  She used to work as a Network engineer.  She has a history of smoking more than 1 pack/day for over 4 years and unfortunately she continues to smoke.  She also drinks alcohol at regular basis and no history of drug abuse.  HPI  Past Medical History:  Diagnosis Date  . Anxiety   .  Arthritis   . Cataract   . GERD (gastroesophageal reflux disease)    Dr Amedeo Plenty is her GI specialist  . 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 Osteoporosis   . Ovarian cyst    right , Dr Radene Knee is her ob.gyn ( she gets pap, mammo, pelvic and [facility] density [facility])  .  Retinal defect    swelling, Dr Zadie Rhine     Past Surgical History:  Procedure Laterality Date  . CESAREAN SECTION    . EYE SURGERY     right side x 2, Dr Gershon Crane    Family History  Problem Relation Age of Onset  . Hyperlipidemia Mother   . Stroke Maternal Grandmother   . Cancer Maternal Grandfather   . Diabetes Paternal Grandmother     Social History Social History   Tobacco Use  . Smoking status: Current Every Day Smoker    Packs/day: 1.00    Years: 30.00    Pack years: 30.00    Types: Cigarettes  . Smokeless tobacco: Never Used  Substance Use Topics  . Alcohol use: No    Alcohol/week: 0.0 standard drinks  . Drug use: No    No Known Allergies  Current Outpatient Medications  Medication Sig Dispense Refill  . acetaminophen-codeine (TYLENOL #3) 300-30 MG tablet Take 1 tablet by mouth every 6 (six) hours.    Marland Kitchen albuterol (PROVENTIL HFA;VENTOLIN HFA) 108 (90 Base) MCG/ACT inhaler Inhale 2 puffs into the lungs every 4 (four) hours as needed for wheezing or shortness of breath (cough, shortness of breath or wheezing.). 1 Inhaler 3  . ALPRAZolam (XANAX) 0.25 MG tablet TAKE 1 TABLET BY MOUTH EVERY DAY AS NEEDED 15 tablet 0  . amoxicillin (AMOXIL) 500 MG capsule Take 500 mg by mouth 3 (three) times daily.    . Beclomethasone Diprop HFA (QVAR REDIHALER) 80 MCG/ACT AERB Inhale 1 puff into the lungs 2 (two) times daily. 3 Inhaler 1  . butalbital-aspirin-caffeine (FIORINAL) 50-325-40 MG capsule Take 1 capsule by mouth 2 (two) times daily as needed for headache. 42 capsule 0  . chlorhexidine (PERIDEX) 0.12 % solution SWISH AND SPIT 1/2 OZ TWICE A DAY  0  . ipratropium (ATROVENT) 0.03 % nasal spray Place 2 sprays into both nostrils 2 (two) times daily. 30 mL 0  . ketorolac (ACULAR) 0.5 % ophthalmic solution Place 1 drop into the right eye 2 (two) times daily. 5 mL 0  . meloxicam (MOBIC) 15 MG tablet TAKE 1/2 TABLET BY MOUTH WEEKLY  1  . methylphenidate (RITALIN) 10 MG tablet Take 10  mg by mouth daily as needed.  0  . methylphenidate (RITALIN) 20 MG tablet Take 20 mg by mouth daily as needed.  0  . Multiple Vitamin (MULTIVITAMIN) capsule Take 1 capsule by mouth daily.    Marland Kitchen PARoxetine (PAXIL) 10 MG tablet Take 1 tablet (10 mg total) by mouth daily. 90 tablet 0  . PREMARIN vaginal cream Apply 1 application topically once a week.  6  . ranitidine (ZANTAC) 150 MG capsule Take 150 mg by mouth 2 (two) times daily.     No current facility-administered medications for this visit.     Review of Systems  Constitutional: positive for fatigue Eyes: negative Ears, nose, mouth, throat, and face: negative Respiratory: positive for cough and sputum Cardiovascular: negative Gastrointestinal: negative Genitourinary:negative Integument/breast: negative Hematologic/lymphatic: negative  Musculoskeletal:positive for back pain and bone pain Neurological: negative Behavioral/Psych: negative Endocrine: negative Allergic/Immunologic: negative  Physical Exam  BOF:BPZWC, healthy, no distress, well nourished, well developed and anxious SKIN: skin color, texture, turgor are normal, no rashes or significant lesions HEAD: Normocephalic, No masses, lesions, tenderness or abnormalities EYES: normal, PERRLA, Conjunctiva are pink and non-injected EARS: External ears normal, Canals clear OROPHARYNX:no exudate, no erythema and lips, buccal mucosa, and tongue normal  NECK: supple, no adenopathy, no JVD LYMPH:  no palpable lymphadenopathy, no hepatosplenomegaly BREAST:not examined LUNGS: clear to auscultation , and palpation HEART: regular rate & rhythm, no murmurs and no gallops ABDOMEN:abdomen soft, non-tender, normal bowel sounds and no masses or organomegaly BACK: No CVA tenderness, Range of motion is normal EXTREMITIES:no joint deformities, effusion, or inflammation, no edema  NEURO: alert & oriented x 3 with fluent speech, no focal motor/sensory deficits  PERFORMANCE STATUS: ECOG  1  LABORATORY DATA: Lab Results  Component Value Date   WBC 9.3 08/25/2015   HGB 13.1 08/25/2015   HCT 36.3 (A) 08/25/2015   MCV 96.9 08/25/2015   PLT 193 08/21/2015      Chemistry      Component Value Date/Time   NA 141 08/21/2015 1320   K 4.2 08/21/2015 1320   CL 104 08/21/2015 1320   CO2 28 08/21/2015 1320   BUN 15 08/21/2015 1320   CREATININE 1.05 (H) 08/21/2015 1320   CREATININE 1.04 08/21/2014 0917      Component Value Date/Time   CALCIUM 9.2 08/21/2015 1320   ALKPHOS 93 08/21/2014 0917   AST 25 08/21/2014 0917   ALT 16 08/21/2014 0917   BILITOT 0.3 08/21/2014 0917       RADIOGRAPHIC STUDIES: Nm Pet Image Initial (pi) Skull Base To Thigh  Result Date: 07/12/2018 CLINICAL DATA:  Initial treatment strategy for lung carcinoma. EXAM: NUCLEAR MEDICINE PET SKULL BASE TO THIGH TECHNIQUE: 5.1 mCi F-18 FDG was injected intravenously. Full-ring PET imaging was performed from the skull base to thigh after the radiotracer. CT data was obtained and used for attenuation correction and anatomic localization. Fasting blood glucose: 90 mg/dl COMPARISON:  Radiograph 06/17/2017, CT 08/24/2017 FINDINGS: Mediastinal blood pool activity: SUV max 1.93 NECK: Chain of intensely hypermetabolic LEFT level 5 and left supraclavicular nodes are present with SUV max equal 10.7. Individual nodes measure up to 12 mm in the supraclavicular nodal station. Incidental CT findings: none CHEST: Intensely hypermetabolic LEFT supraclavicular mass with SUV max equal 11.8. Mass difficult to define on the noncontrast exam but measures approximately 5.5 cm. There is postobstructive collapse of the LEFT upper lobe. Hypermetabolic ipsilateral and contralateral nodal metastasis. RIGHT paratracheal lymph nodes are enlarged with SUV max equal 9.9. Enlarged hypermetabolic subcarinal lymph node with SUV max equal 14.5 Single hypermetabolic precordial node adjacent to the LEFT atrium. No additional evidence of pulmonary  metastasis. Centrilobular emphysema the upper lobes. Incidental CT findings: none ABDOMEN/PELVIS: Hypermetabolic periportal lymph nodes SUV max equal 13. Lymph node positioned between the pancreas and liver measures 14 mm short axis. Focal lesion appears to reside within the pancreatic body SUV max equal 6.3 found on image 92 fused data set. Lesion not and depicted on the noncontrast CT. Additional lesion along the tail the pancreas is favored adenopathy (image 87) Incidental CT findings: none SKELETON: There are multiple foci of skeletal metastasis within the thoracic and lumbar spine. These lesions are not well appreciated on the CT portion exam. Example lesion at the L5 vertebral body with SUV max equal 9.5. Lesions  in the LEFT pelvis on either side of the SI joint with SUV max equal 9.5. Broad lesion in the LEFT iliac wing with mild subtle periosteal reaction. Incidental CT findings: none IMPRESSION: 1. Bulky LEFT suprahilar mass consistent with primary bronchogenic carcinoma. Postobstructive collapse in LEFT upper lobe. Findings suggest small cell lung cancer. 2. Upper lobe emphysema. 3. Evidence of local ipsilateral and contralateral mediastinal nodal metastasis. 4. Cluster of hypermetabolic metastatic lymph nodes in the LEFT neck involving the LEFT supraclavicular and LEFT level V lymph nodes. These would be assessable for biopsy. 5. Hypermetabolic nodal metastasis below the diaphragm in the porta hepatis. 6. Potential metastatic lesion to the pancreas. 7. Multiple foci of skeletal metastasis within the lumbar and thoracic spine. Multiple sites in the osseous pelvis. 8. If multidisciplinary follow up management is desired, this is available in the [place] through the [facility] [phone]. Electronically Signed   By: Suzy Bouchard M.D.   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  The patient has no tissue diagnosis yet.   PLAN: I had a lengthy discussion with the patient and her family today about her current disease stage, and further investigation to confirm diagnosis. I recommended for the patient to have MRI of the brain to complete the staging work-up. I personally and independently reviewed the PET scan images and discussed the result and showed the images to the patient and her family. I recommended for the patient to keep her appointment with Dr. Roxan Hockey early next week for consideration of excisional biopsy of left supraclavicular lymph node for tissue diagnosis. I also referred the patient to radiation oncology for consideration of palliative radiotherapy to the painful metastatic disease in the thoracic and lumbar spines as well as the obstructive left suprahilar mass. I will arrange for the patient to come back for follow-up visit in less than 2 weeks for evaluation and more detailed discussion of her treatment options based on the final tissue diagnosis and staging work-up. For pain management she will continue on her current pain medication with Fioricet by her primary care physician. The patient is also seeking a second opinion at [facility]. She was advised to call if she has any other concerning symptoms in the interval. The patient voices understanding of current disease status and treatment options and is in agreement with the current care plan.  All questions were answered. The patient knows to call the clinic with any problems, questions or concerns. We can certainly see the patient much  sooner if necessary.  Thank you so much for allowing me to participate in the care of Christina Hinton. I will continue to follow up the patient with you and assist in her care.  I spent 40 minutes counseling the patient face to face. The total time spent in the appointment was 60 minutes.  Disclaimer: This note was dictated with voice recognition software. Similar sounding words can inadvertently be transcribed and may not be corrected upon review.   Eilleen Kempf July 19, 2018, 8:59 AM

## 2018-07-19 NOTE — Patient Instructions (Signed)
Steps to Quit Smoking    Smoking tobacco can be bad for your health. It can also affect almost every organ in your body. Smoking puts you and people around you at risk for many serious long-lasting (chronic) diseases. Quitting smoking is hard, but it is one of the best things that you can do for your health. It is never too late to quit.  What are the benefits of quitting smoking?  When you quit smoking, you lower your risk for getting serious diseases and conditions. They can include:  · Lung cancer or lung disease.  · Heart disease.  · Stroke.  · Heart attack.  · Not being able to have children (infertility).  · Weak bones (osteoporosis) and broken bones (fractures).  If you have coughing, wheezing, and shortness of breath, those symptoms may get better when you quit. You may also get sick less often. If you are pregnant, quitting smoking can help to lower your chances of having a baby of low birth weight.  What can I do to help me quit smoking?  Talk with your doctor about what can help you quit smoking. Some things you can do (strategies) include:  · Quitting smoking totally, instead of slowly cutting back how much you smoke over a period of time.  · Going to in-person counseling. You are more likely to quit if you go to many counseling sessions.  · Using resources and support systems, such as:  ? Online chats with a counselor.  ? Phone quitlines.  ? Printed self-help materials.  ? Support groups or group counseling.  ? Text messaging programs.  ? Mobile phone apps or applications.  · Taking medicines. Some of these medicines may have nicotine in them. If you are pregnant or breastfeeding, do not take any medicines to quit smoking unless your doctor says it is okay. Talk with your doctor about counseling or other things that can help you.  Talk with your doctor about using more than one strategy at the same time, such as taking medicines while you are also going to in-person counseling. This can help make  quitting easier.  What things can I do to make it easier to quit?  Quitting smoking might feel very hard at first, but there is a lot that you can do to make it easier. Take these steps:  · Talk to your family and friends. Ask them to support and encourage you.  · Call phone quitlines, reach out to support groups, or work with a counselor.  · Ask people who smoke to not smoke around you.  · Avoid places that make you want (trigger) to smoke, such as:  ? Bars.  ? Parties.  ? Smoke-break areas at work.  · Spend time with people who do not smoke.  · Lower the stress in your life. Stress can make you want to smoke. Try these things to help your stress:  ? Getting regular exercise.  ? Deep-breathing exercises.  ? Yoga.  ? Meditating.  ? Doing a body scan. To do this, close your eyes, focus on one area of your body at a time from head to toe, and notice which parts of your body are tense. Try to relax the muscles in those areas.  · Download or buy apps on your mobile phone or tablet that can help you stick to your quit plan. There are many free apps, such as QuitGuide from the CDC (Centers for Disease Control and Prevention). You can find more   support from smokefree.gov and other websites.  This information is not intended to replace advice given to you by your health care provider. Make sure you discuss any questions you have with your health care provider.  Document Released: 03/18/2009 Document Revised: 01/18/2016 Document Reviewed: 10/06/2014  Elsevier Interactive Patient Education © 2019 [employer]

## 2018-07-19 NOTE — Telephone Encounter (Signed)
Friend asking for pt prognosis. I told her to wait until biopsy and come with pt to her appt.

## 2018-07-22 ENCOUNTER — Encounter: Payer: BC Managed Care – PPO | Admitting: Thoracic Surgery (Cardiothoracic Vascular Surgery)

## 2018-07-25 ENCOUNTER — Ambulatory Visit (HOSPITAL_COMMUNITY)
Admission: RE | Admit: 2018-07-25 | Discharge: 2018-07-25 | Disposition: A | Discharge status: Home / Self Care | Payer: BC Managed Care – PPO | Source: Ambulatory Visit | Attending: Internal Medicine | Admitting: Internal Medicine

## 2018-07-25 DIAGNOSIS — C3490 Malignant neoplasm of unspecified part of unspecified bronchus or lung: Secondary | ICD-10-CM | POA: Diagnosis not present

## 2018-07-25 MED ORDER — GADOBUTROL 1 MMOL/ML IV SOLN
4.0000 mL | Freq: Once | INTRAVENOUS | Status: AC | PRN
  Administered 2018-07-25: 4 mL via INTRAVENOUS

## 2018-07-26 ENCOUNTER — Encounter: Payer: Self-pay | Admitting: Thoracic Surgery (Cardiothoracic Vascular Surgery)

## 2018-07-26 ENCOUNTER — Other Ambulatory Visit: Payer: Self-pay

## 2018-07-26 ENCOUNTER — Institutional Professional Consult (permissible substitution): Payer: BC Managed Care – PPO | Admitting: Thoracic Surgery (Cardiothoracic Vascular Surgery)

## 2018-07-26 VITALS — BP 120/88 | HR 72 | Resp 18 | Ht <= 58 in | Wt 90.6 lb

## 2018-07-26 DIAGNOSIS — R918 Other nonspecific abnormal finding of lung field: Secondary | ICD-10-CM | POA: Diagnosis not present

## 2018-07-26 DIAGNOSIS — R590 Localized enlarged lymph nodes: Secondary | ICD-10-CM

## 2018-07-26 MED ORDER — OXYCODONE HCL 5 MG PO TABS: 5.0000 mg | ORAL_TABLET | Freq: Four times a day (QID) | ORAL | 0 refills | Status: AC | PRN

## 2018-07-26 NOTE — Progress Notes (Signed)
PCP is Glenford Bayley, DO Referring Provider is Camille Bal, PA-C  Chief Complaint  Patient presents with  . Lung Mass    new patient consultation, PET 07/12/2018, Chest CT 06/28/2018    HPI: Christina Hinton is sent for a consultation for possible biopsy.  Christina Hinton is a 63 year old woman with a history of heavy tobacco abuse (at least 1 pack/day for 40 years), anxiety, arthritis, reflux, and migraines.  She developed a cough a few months ago.  This was not unusual for her except that it just never resolved.  She initially thought it was due to sinus congestion.  On January 13 she experienced severe pain with coughing.  She went to a walk-in clinic.  A chest x-ray showed a lung mass.  That led to a CT and ultimately a PET/CT.  Findings on CT and PET/CT are consistent with metastatic lung cancer.  She saw Dr. Julien Nordmann last week and is scheduled for a second opinion with Dr. Mindi Junker at [facility] next week.  She denies any shortness of breath.  She says her appetite is good.  She has not had any weight loss.  She continues to smoke.  Has frequent migraines.  She continues to have significant pain in both her chest and her back.  PET/CT showed spinal metastases.  She sees radiation oncology about that next week.  She was given a prescription for tramadol.  She tried that once but it was ineffective.  She has not taken any more since.  She is requesting a stronger pain medication. Past Medical History:  Diagnosis Date  . Anxiety   . Arthritis   . Cataract   . GERD (gastroesophageal reflux disease)    Dr Amedeo Plenty is her GI specialist  . 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 Osteoporosis   . Ovarian cyst    right , Dr Radene Knee is her ob.gyn ( she gets pap, mammo, pelvic and bone density scan at [facility])  . Retinal defect    swelling, Dr Zadie Rhine     Past Surgical History:  Procedure Laterality Date  .  CESAREAN SECTION    . EYE SURGERY     right side x 2, Dr Gershon Crane    Family History  Problem Relation Age of Onset  . Hyperlipidemia Mother   . Stroke Maternal Grandmother   . Cancer Maternal Grandfather   . Diabetes Paternal Grandmother     Social History Social History   Tobacco Use  . Smoking status: Current Every Day Smoker    Packs/day: 1.00    Years: 30.00    Pack years: 30.00    Types: Cigarettes  . Smokeless tobacco: Never Used  Substance Use Topics  . Alcohol use: No    Alcohol/week: 0.0 standard drinks  . Drug use: No    Current Outpatient Medications  Medication Sig Dispense Refill  . albuterol (PROVENTIL HFA;VENTOLIN HFA) 108 (90 Base) MCG/ACT inhaler Inhale 2 puffs into the lungs every 4 (four) hours as needed for wheezing or shortness of breath (cough, shortness of breath or wheezing.). 1 Inhaler 3  . ALPRAZolam (XANAX) 0.25 MG tablet TAKE 1 TABLET BY MOUTH EVERY DAY AS NEEDED 15 tablet 0  . Beclomethasone Diprop HFA (QVAR REDIHALER) 80 MCG/ACT AERB Inhale 1 puff into the lungs 2 (two) times daily. 3 Inhaler 1  . butalbital-acetaminophen-caffeine (FIORICET WITH  CODEINE) 50-325-40-30 MG capsule Take 1 capsule by mouth as directed.    Marland Kitchen ipratropium (ATROVENT) 0.03 % nasal spray Place 2 sprays into both nostrils 2 (two) times daily. 30 mL 0  . ketorolac (ACULAR) 0.5 % ophthalmic solution Place 1 drop into the right eye 2 (two) times daily. 5 mL 0  . meloxicam (MOBIC) 15 MG tablet TAKE 1/2 TABLET BY MOUTH WEEKLY  1  . methylphenidate (RITALIN) 10 MG tablet Take 10 mg by mouth daily as needed.  0  . methylphenidate (RITALIN) 20 MG tablet Take 20 mg by mouth daily as needed.  0  . Multiple Vitamin (MULTIVITAMIN) capsule Take 1 capsule by mouth daily.    Marland Kitchen PARoxetine (PAXIL) 10 MG tablet Take 1 tablet (10 mg total) by mouth daily. 90 tablet 0  . PREMARIN vaginal cream Apply 1 application topically once a week.  6  . oxyCODONE (OXY IR/ROXICODONE) 5 MG immediate release  tablet Take 1 tablet (5 mg total) by mouth every 6 (six) hours as needed for severe pain. 20 tablet 0   No current facility-administered medications for this visit.     No Known Allergies  Review of Systems  Constitutional: Positive for fatigue. Negative for unexpected weight change.  HENT: Negative for trouble swallowing and voice change.   Respiratory: Positive for cough and wheezing.   Cardiovascular: Positive for chest pain.  Musculoskeletal: Positive for back pain.  Neurological: Positive for headaches.    BP 120/88 (BP Location: Left Arm, Patient Position: Sitting, Cuff Size: Normal)   Pulse 72   Resp 18   Ht 4\' 9"  (1.448 m)   Wt 90 lb 9.6 oz (41.1 kg)   SpO2 96% Comment: RA  BMI 19.61 kg/m  Physical Exam Vitals signs reviewed.  Constitutional:      General: She is not in acute distress.    Appearance: She is normal weight.  HENT:     Head: Normocephalic and atraumatic.  Eyes:     General: No scleral icterus.    Extraocular Movements: Extraocular movements intact.     Pupils: Pupils are equal, round, and reactive to light.  Cardiovascular:     Rate and Rhythm: Normal rate and regular rhythm.     Heart sounds: No murmur.  Pulmonary:     Breath sounds: Normal breath sounds. No wheezing.  Abdominal:     General: There is no distension.     Palpations: Abdomen is soft.     Tenderness: There is no abdominal tenderness.  Lymphadenopathy:     Cervical: Cervical adenopathy (Left cervical and supraclavicular) present.  Skin:    General: Skin is warm and dry.  Neurological:     General: No focal deficit present.     Mental Status: She is alert.     Cranial Nerves: No cranial nerve deficit.    Diagnostic Tests: NUCLEAR MEDICINE PET SKULL BASE TO THIGH  TECHNIQUE: 5.1 mCi F-18 FDG was injected intravenously. Full-ring PET imaging was performed from the skull base to thigh after the radiotracer. CT data was obtained and used for attenuation correction and  anatomic localization.  Fasting blood glucose: 90 mg/dl  COMPARISON:  Radiograph 06/17/2017, CT 08/24/2017  FINDINGS: Mediastinal blood pool activity: SUV max 1.93  NECK: Chain of intensely hypermetabolic LEFT level 5 and left supraclavicular nodes are present with SUV max equal 10.7. Individual nodes measure up to 12 mm in the supraclavicular nodal station.  Incidental CT findings: none  CHEST: Intensely hypermetabolic LEFT supraclavicular mass with  SUV max equal 11.8. Mass difficult to define on the noncontrast exam but measures approximately 5.5 cm. There is postobstructive collapse of the LEFT upper lobe.  Hypermetabolic ipsilateral and contralateral nodal metastasis. RIGHT paratracheal lymph nodes are enlarged with SUV max equal 9.9. Enlarged hypermetabolic subcarinal lymph node with SUV max equal 14.5  Single hypermetabolic precordial node adjacent to the LEFT atrium. No additional evidence of pulmonary metastasis. Centrilobular emphysema the upper lobes.  Incidental CT findings: none  ABDOMEN/PELVIS: Hypermetabolic periportal lymph nodes SUV max equal 13. Lymph node positioned between the pancreas and liver measures 14 mm short axis. Focal lesion appears to reside within the pancreatic body SUV max equal 6.3 found on image 92 fused data set. Lesion not and depicted on the noncontrast CT. Additional lesion along the tail the pancreas is favored adenopathy (image 87)  Incidental CT findings: none  SKELETON: There are multiple foci of skeletal metastasis within the thoracic and lumbar spine. These lesions are not well appreciated on the CT portion exam. Example lesion at the L5 vertebral body with SUV max equal 9.5. Lesions in the LEFT pelvis on either side of the SI joint with SUV max equal 9.5. Broad lesion in the LEFT iliac wing with mild subtle periosteal reaction.  Incidental CT findings: none  IMPRESSION: 1. Bulky LEFT suprahilar mass  consistent with primary bronchogenic carcinoma. Postobstructive collapse in LEFT upper lobe. Findings suggest small cell lung cancer. 2. Upper lobe emphysema. 3. Evidence of local ipsilateral and contralateral mediastinal nodal metastasis. 4. Cluster of hypermetabolic metastatic lymph nodes in the LEFT neck involving the LEFT supraclavicular and LEFT level V lymph nodes. These would be assessable for biopsy. 5. Hypermetabolic nodal metastasis below the diaphragm in the porta hepatis. 6. Potential metastatic lesion to the pancreas. 7. Multiple foci of skeletal metastasis within the lumbar and thoracic spine. Multiple sites in the osseous pelvis. 8. If multidisciplinary follow up management is desired, this is available in the [place] through the [facility] [phone].   Electronically Signed   By: Suzy Bouchard M.D.   On: 07/12/2018 13:52 I personally reviewed the PET/CT images and concur with the findings noted above  Impression: Christina Hinton is a 63 year old woman with a history of tobacco abuse who presents with a persistent cough and chest and back pain.  She was found to have a left hilar mass with extensive hilar, mediastinal, supraclavicular, and extra thoracic adenopathy.  She also has bony metastases involving her spine and pelvis.  She needs a tissue diagnosis in order to guide therapy.  I think the best option would be an excisional biopsy of a left supraclavicular lymph node.  This is easily accessible, can be done under local anesthesia, and will provide adequate tissue for molecular testing.  I discussed the general nature of the procedure with her.  She understands the incision to be used, and that it will be done on an outpatient basis.  We discussed the indications, risk, benefits, and alternatives.  She understands the risks include reaction to a medication, infection, seroma, as well as possibility of other unforeseeable  complications.  She is having severe back pain associated with her vertebral metastases.  She was given a prescription for tramadol but that was ineffective and she is not using it.  She is requesting a stronger narcotic.  I am going to go ahead and give her a prescription for oxycodone 5 mg every 6 hours as needed for pain.  She was instructed that she  can use Tylenol along with that which may potentiate the effect.  Plan: Oxycodone 5 mg p.o. every 6 hours as needed for pain (dispense 20 tablets, no refills).  Left supraclavicular lymph node biopsy on Wednesday, 07/31/2018.  Melrose Nakayama, MD [facility] [phone]

## 2018-07-30 ENCOUNTER — Inpatient Hospital Stay: Payer: BC Managed Care – PPO | Admitting: Physician Assistant

## 2018-07-30 ENCOUNTER — Ambulatory Visit
Admission: RE | Admit: 2018-07-30 | Discharge: 2018-07-30 | Disposition: A | Discharge status: Home / Self Care | Payer: BC Managed Care – PPO | Source: Ambulatory Visit | Attending: Radiation Oncology | Admitting: Radiation Oncology

## 2018-07-30 ENCOUNTER — Other Ambulatory Visit: Payer: Self-pay | Admitting: *Deleted

## 2018-07-30 ENCOUNTER — Encounter: Payer: Self-pay | Admitting: Physician Assistant

## 2018-07-30 ENCOUNTER — Ambulatory Visit: Payer: BC Managed Care – PPO

## 2018-07-30 VITALS — BP 114/83 | HR 99 | Temp 98.9°F | Resp 18 | Ht <= 58 in | Wt 89.1 lb

## 2018-07-30 DIAGNOSIS — C3490 Malignant neoplasm of unspecified part of unspecified bronchus or lung: Secondary | ICD-10-CM | POA: Insufficient documentation

## 2018-07-30 DIAGNOSIS — G893 Neoplasm related pain (acute) (chronic): Secondary | ICD-10-CM

## 2018-07-30 DIAGNOSIS — C3412 Malignant neoplasm of upper lobe, left bronchus or lung: Secondary | ICD-10-CM | POA: Diagnosis not present

## 2018-07-30 DIAGNOSIS — C778 Secondary and unspecified malignant neoplasm of lymph nodes of multiple regions: Secondary | ICD-10-CM | POA: Diagnosis not present

## 2018-07-30 DIAGNOSIS — C7951 Secondary malignant neoplasm of bone: Secondary | ICD-10-CM | POA: Diagnosis not present

## 2018-07-30 DIAGNOSIS — F17210 Nicotine dependence, cigarettes, uncomplicated: Secondary | ICD-10-CM | POA: Diagnosis not present

## 2018-07-30 DIAGNOSIS — C3492 Malignant neoplasm of unspecified part of left bronchus or lung: Secondary | ICD-10-CM

## 2018-07-30 NOTE — Progress Notes (Signed)
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  The patient has no tissue diagnosis yet.  PRIOR THERAPY: None  CURRENT THERAPY: Pending tissue diagnosis.   INTERVAL HISTORY: Christina Hinton 63 y.o. female returns for evaluation accompanied by her significant other, Christina Hinton, and her friend Christina Hinton. The patient sought out a second opinion at [facility] with Dr. Henrene Pastor. A needle aspiration was performed at his office yesterday of the left supraclavicular lymph node. The patient is planning on receiving her first dose of chemotherapy at [facility] pending the results of her tissue diagnosis.   The patient still continues to experience significant pain in the lower lumbar region secondary to her bony metastasis. She also complains of intermittent headaches. A brain MRI was performed and previously reviewed with the patient at Dr. Blanch Media office which did not show any evidence of brain metastasis. She denies any associated symptoms such as nausea, vomiting, visual changes, gait abnormalities, or seizures. For pain control, she was prescribed tramadol by her PCP; however this was ineffective at managing her pain. She was then given a prescription for 5 mg of oxycodone by Dr. Roxan Hockey.  She was also advised to take Tylenol for breakthrough pain.    She denies any other new symptoms in the interval. She  denies any fevers, chills, night sweats, or weight loss.  She denies any nausea, vomiting, diarrhea, or constipation. She endorses  baseline shortness of breath and an intermittent cough but she denies any chest pain or hemoptysis. For smoking cessation, she is cutting back on her cigarette smoking. She has gradually reduced the number of cigarettes used in a day. The patient states that she has not had cigarette in over a day. The patient is here today for evaluation and to discuss recommendations.  MEDICAL HISTORY: Past Medical History:  Diagnosis Date  . Anxiety   . Arthritis   . Cataract   . GERD (gastroesophageal reflux disease)    Dr Amedeo Plenty is her GI specialist  . 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 Osteoporosis   . Ovarian cyst    right , Dr Radene Knee is her ob.gyn ( she gets pap, mammo, pelvic and bone density scan at [facility])  . Retinal defect    swelling, Dr Zadie Rhine     ALLERGIES:  has No Known Allergies.  MEDICATIONS:  Current Outpatient Medications  Medication Sig Dispense Refill  . albuterol (PROVENTIL HFA;VENTOLIN HFA) 108 (90 Base) MCG/ACT inhaler Inhale 2 puffs into the lungs every 4 (four) hours as needed for wheezing or shortness of breath (cough, shortness of breath or wheezing.). 1 Inhaler 3  . ALPRAZolam (XANAX) 0.25 MG tablet TAKE 1 TABLET BY MOUTH EVERY DAY AS NEEDED 15 tablet 0  . Beclomethasone Diprop HFA (QVAR REDIHALER) 80 MCG/ACT AERB Inhale 1 puff into the lungs 2 (two) times daily. 3 Inhaler 1  . butalbital-acetaminophen-caffeine (FIORICET WITH CODEINE) 50-325-40-30 MG capsule Take 1 capsule by mouth as  directed.    Marland Kitchen ipratropium (ATROVENT) 0.03 % nasal spray Place 2 sprays into both nostrils 2 (two) times daily. 30 mL 0  . ketorolac (ACULAR) 0.5 % ophthalmic solution Place 1 drop into the right eye 2 (two) times daily. 5 mL 0  . meloxicam (MOBIC) 15 MG tablet TAKE 1/2 TABLET BY MOUTH WEEKLY  1  . methylphenidate (RITALIN) 10 MG tablet Take 10 mg by mouth daily as needed.  0  .  methylphenidate (RITALIN) 20 MG tablet Take 20 mg by mouth daily as needed.  0  . Multiple Vitamin (MULTIVITAMIN) capsule Take 1 capsule by mouth daily.    Marland Kitchen oxyCODONE (OXY IR/ROXICODONE) 5 MG immediate release tablet Take 1 tablet (5 mg total) by mouth every 6 (six) hours as needed for severe pain. 20 tablet 0  . PARoxetine (PAXIL) 10 MG tablet Take 1 tablet (10 mg total) by mouth daily. 90 tablet 0  . PREMARIN vaginal cream Apply 1 application topically once a week.  6   No current facility-administered medications for this visit.     SURGICAL HISTORY:  Past Surgical History:  Procedure Laterality Date  . CESAREAN SECTION    . EYE SURGERY     right side x 2, Dr Gershon Crane    REVIEW OF SYSTEMS:   Review of Systems  Constitutional: Negative for appetite change, chills, fatigue, fever and unexpected weight change.  HENT:   Negative for mouth sores, nosebleeds, sore throat and trouble swallowing.   Eyes: Negative for eye problems and icterus.  Respiratory: Positive for baseline shortness of breath and cough. Negative for hemoptysis and wheezing.   Cardiovascular: Negative for chest pain and leg swelling.  Gastrointestinal: Negative for abdominal pain, constipation, diarrhea, nausea and vomiting.  Genitourinary: Negative for bladder incontinence, difficulty urinating, dysuria, frequency and hematuria.   Musculoskeletal: Positive for back pain. Negative for gait problem, neck pain and neck stiffness.  Skin: Negative for itching and rash.  Neurological: Positive for headaches. Negative for dizziness, extremity weakness, gait problem,  light-headedness and seizures.  Hematological: Positive for left supraclavicular lymphadenopathy in addition to new onset left cervical lymph node.  Does not bruise/bleed easily.  Psychiatric/Behavioral: Negative for confusion, depression and sleep disturbance. The patient is not nervous/anxious.     PHYSICAL EXAMINATION:  Blood pressure 114/83, pulse 99,  temperature 98.9 F (37.2 C), temperature source Oral, resp. rate 18, height 4\' 9"  (1.448 m), weight 89 lb 1.6 oz (40.4 kg), SpO2 94 %.  ECOG PERFORMANCE STATUS: 1 - Symptomatic but completely ambulatory  Physical Exam  Constitutional: Oriented to person, place, and time and well-developed, well-nourished, and in no distress. No distress.  HENT:  Head: Normocephalic and atraumatic.  Mouth/Throat: Oropharynx is clear and moist. No oropharyngeal exudate.  Eyes: Conjunctivae are normal. Right eye exhibits no discharge. Left eye exhibits no discharge. No scleral icterus.  Neck: Normal range of motion. Neck supple.  Cardiovascular: Normal rate, regular rhythm, normal heart sounds and intact distal pulses.   Pulmonary/Chest: Effort normal and breath sounds normal. No respiratory distress. No wheezes. No rales.  Abdominal: Soft. Bowel sounds are normal. Exhibits no distension and no mass. There is no tenderness.  Musculoskeletal: Normal range of motion. Exhibits no edema.  Lymphadenopathy:    Palpable left cervical lymphadenopathy and large left supraclavicular lymphadenopathy. .  Neurological: Alert and oriented to person, place, and time. Exhibits normal muscle tone. Gait normal. Coordination normal.  Skin: Skin is warm and dry. No rash noted. Not diaphoretic. No erythema. No  pallor.  Psychiatric: Mood, memory and judgment normal.  Vitals reviewed.  LABORATORY DATA: Lab Results  Component Value Date   WBC 9.3 08/25/2015   HGB 13.1 08/25/2015   HCT 36.3 (A) 08/25/2015   MCV 96.9 08/25/2015   PLT 193 08/21/2015      Chemistry      Component Value Date/Time   NA 141 08/21/2015 1320   K 4.2 08/21/2015 1320   CL 104 08/21/2015 1320   CO2 28 08/21/2015 1320   BUN 15 08/21/2015 1320   CREATININE 1.05 (H) 08/21/2015 1320   CREATININE 1.04 08/21/2014 0917      Component Value Date/Time   CALCIUM 9.2 08/21/2015 1320   ALKPHOS 93 08/21/2014 0917   AST 25 08/21/2014 0917   ALT 16  08/21/2014 0917   BILITOT 0.3 08/21/2014 0917       RADIOGRAPHIC STUDIES:  Mr Jeri Cos PT Contrast  Result Date: 07/25/2018 CLINICAL DATA:  Non-small cell lung cancer. EXAM: MRI HEAD WITHOUT AND WITH CONTRAST TECHNIQUE: Multiplanar, multiecho pulse sequences of the brain and surrounding structures were obtained without and with intravenous contrast. CONTRAST:  4 mL Gadavist COMPARISON:  Head CT 06/22/2014 FINDINGS: Brain: There is no evidence of acute infarct, intracranial hemorrhage, mass, midline shift, or extra-axial fluid collection. There is mild cerebral atrophy. A small chronic right cerebellar infarct is noted. Patchy T2 hyperintensities in the cerebral white matter bilaterally are nonspecific but compatible with mild-to-moderate chronic small vessel ischemic disease. No abnormal enhancement is identified. Vascular: Major intracranial vascular flow voids are preserved. Skull and upper cervical spine: No suspicious marrow lesion. Sinuses/Orbits: Right cataract extraction. Small volume mucous in the left sphenoid sinus. Clear mastoid air cells. Other: None. IMPRESSION: 1. No evidence of intracranial metastases. 2. Mild-to-moderate chronic small vessel ischemic disease. Electronically Signed   By: Logan Bores M.D.   On: 07/25/2018 19:53   Nm Pet Image Initial (pi) Skull Base To Thigh  Result Date: 07/12/2018 CLINICAL DATA:  Initial treatment strategy for lung carcinoma. EXAM: NUCLEAR MEDICINE PET SKULL BASE TO THIGH TECHNIQUE: 5.1 mCi F-18 FDG was injected intravenously. Full-ring PET imaging was performed from the skull base to thigh after the radiotracer. CT data was obtained and used for attenuation correction and anatomic localization. Fasting blood glucose: 90 mg/dl COMPARISON:  Radiograph 06/17/2017, CT 08/24/2017 FINDINGS: Mediastinal blood pool activity: SUV max 1.93 NECK: Chain of intensely hypermetabolic LEFT level 5 and left supraclavicular nodes are present with SUV max equal 10.7.  Individual nodes measure up to 12 mm in the supraclavicular nodal station. Incidental CT findings: none CHEST: Intensely hypermetabolic LEFT supraclavicular mass with SUV max equal 11.8. Mass difficult to define on the noncontrast exam but measures approximately 5.5 cm. There is postobstructive collapse of the LEFT upper lobe. Hypermetabolic ipsilateral and contralateral nodal metastasis. RIGHT paratracheal lymph nodes are enlarged with SUV max equal 9.9. Enlarged hypermetabolic subcarinal lymph node with SUV max equal 14.5 Single hypermetabolic precordial node adjacent to the LEFT atrium. No additional evidence of pulmonary metastasis. Centrilobular emphysema the upper lobes. Incidental CT findings: none ABDOMEN/PELVIS: Hypermetabolic periportal lymph nodes SUV max equal 13. Lymph node positioned between the pancreas and liver measures 14 mm short axis. Focal lesion appears to reside within the pancreatic body SUV max equal 6.3 found on image 92 fused data set. Lesion not and depicted on the noncontrast CT. Additional lesion along the tail the pancreas is favored adenopathy (image 87) Incidental CT findings: none SKELETON: There are multiple foci of skeletal metastasis  within the thoracic and lumbar spine. These lesions are not well appreciated on the CT portion exam. Example lesion at the L5 vertebral body with SUV max equal 9.5. Lesions in the LEFT pelvis on either side of the SI joint with SUV max equal 9.5. Broad lesion in the LEFT iliac wing with mild subtle periosteal reaction. Incidental CT findings: none IMPRESSION: 1. Bulky LEFT suprahilar mass consistent with primary bronchogenic carcinoma. Postobstructive collapse in LEFT upper lobe. Findings suggest small cell lung cancer. 2. Upper lobe emphysema. 3. Evidence of local ipsilateral and contralateral mediastinal nodal metastasis. 4. Cluster of hypermetabolic metastatic lymph nodes in the LEFT neck involving the LEFT supraclavicular and LEFT level V lymph  nodes. These would be assessable for biopsy. 5. Hypermetabolic nodal metastasis below the diaphragm in the porta hepatis. 6. Potential metastatic lesion to the pancreas. 7. Multiple foci of skeletal metastasis within the lumbar and thoracic spine. Multiple sites in the osseous pelvis. 8. If multidisciplinary follow up management is desired, this is available in the [place] through the [facility] [phone]. Electronically Signed   By: Suzy Bouchard M.D.   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 She presented with a left suprahilar mass with obstruction of the left upper lobe as well as significant lymphadenopathy bilaterally in the mediastinum, left cervical, left supraclavicular, and porta hepatis lymphadenopathy, in addition to a pancreatic mass and multiple bone metastatasis in the lumbar, thoracic, as well as pelvic bones. The patient has no tissue diagnosis yet. A brain MRI was performed to complete the staging workup which did not show any evidence of brain metastases.   The patient had an appointment yesterday with Dr. Henrene Pastor at [facility] for a second opinion. A needle aspirate of the left supraclavicular lymph node was performed at that time. Results are pending. However, the patient is planning on receiving her first dose of chemotherapy at [facility] tomorrow based on the preliminary aspirate results. At this point in time, she will proceed with her chemotherapy treatment under the care of Dr. Henrene Pastor. We will not schedule any follow up visits unless the patient requests our care in the future. She knows we would be happy to see her and she may call the clinic with any problems, concerns, or questions in the future.   An excisional biopsy of the left supraclavicular lymph node was scheduled tomorrow with Dr.  Roxan Hockey. The patient will be canceling her procedure unless the needle aspirate results from yesterday prove to be inconclusive.   For pain control, the patient will continue with her current regimen of 5 mg p.o. of oxycodone and tylenol for break through pain.  The patient was encouraged to continue abstaining from tobacco use. She has significantly cut back and states she has not smoked in over a day. She is utilizing NRT. She was provided with several handouts regarding smoking cessation at the previous visit. She vocalized an understanding of the importance smoking cessation. She knows we would be happy to help provide additional assistance/resources in the future if necessary.   The patient was advised to call immediately if she has any concerning symptoms in the interval. The patient voices understanding of current disease status and treatment options and is in agreement with the current care plan. All questions were answered.   No orders of the defined types were placed  in this encounter.    Venus Ruhe L Nason Conradt, PA-C 07/30/18  ADDENDUM: Hematology/Oncology Attending: I had a face-to-face encounter with the patient today.  I recommended her care plan.  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  The patient was seen by Dr. Mindi Junker for a second opinion and he arranged for the biopsy to be performed at [facility] yesterday.  She is expected to start the first cycle of her systemic chemotherapy likely with carboplatin, etoposide and Tecentriq tomorrow at [facility].  The patient had MRI of the brain performed recently that showed no concerning findings for disease progression.  She was here for evaluation and discussion of her imaging studies as well as treatment  options. I strongly recommended for the patient to continue with her current treatment plan by Dr. Mindi Junker at [facility] based on her wishes. We will see the patient in the future on as-needed basis. She was advised to call immediately if she has any concerning symptoms in the interval.  Disclaimer: This note was dictated with voice recognition software. Similar sounding words can inadvertently be transcribed and may be missed upon review. Eilleen Kempf, MD 07/30/18

## 2018-07-31 ENCOUNTER — Ambulatory Visit: Payer: BC Managed Care – PPO | Admitting: Radiation Oncology

## 2018-07-31 ENCOUNTER — Encounter (HOSPITAL_COMMUNITY): Admission: RE | Payer: Self-pay | Source: Home / Self Care

## 2018-07-31 ENCOUNTER — Ambulatory Visit (HOSPITAL_COMMUNITY)
Admission: RE | Admit: 2018-07-31 | Payer: BC Managed Care – PPO | Source: Home / Self Care | Admitting: Thoracic Surgery (Cardiothoracic Vascular Surgery)

## 2018-07-31 SURGERY — LYMPH NODE BIOPSY
Anesthesia: Monitor Anesthesia Care | Site: Chest | Laterality: Left

## 2018-08-01 ENCOUNTER — Ambulatory Visit: Payer: BC Managed Care – PPO | Admitting: Physician Assistant

## 2018-08-07 ENCOUNTER — Encounter: Payer: Self-pay | Admitting: *Deleted

## 2018-08-07 NOTE — Progress Notes (Signed)
Oncology Nurse Navigator Documentation  Oncology Nurse Navigator Flowsheets 08/07/2018  Navigator Location [facility]  Navigator Encounter Type Other/I followed up with Dr. Julien Nordmann, patient is being treated at [facility].   Treatment Phase Abnormal Scans  Barriers/Navigation Needs No barriers at this time  Acuity Level 1  Time Spent with Patient 15

## 2019-07-05 IMAGING — MR MR HEAD WO/W CM
10 of 13 series · 37 of 48 positions shown · IV contrast (gadavist)
Comparison: Head CT 06/22/2014

CLINICAL DATA: Non-small cell lung cancer.

EXAM:
MRI HEAD WITHOUT AND WITH CONTRAST
TECHNIQUE: Multiplanar, multiecho pulse sequences of the brain and surrounding
structures were obtained without and with intravenous contrast.
CONTRAST:  4 mL Gadavist

[Series 3: T1 · sagittal · 5.0mm · 0.47mm/px · 1 of 24 slices shown]
[im 1/24]
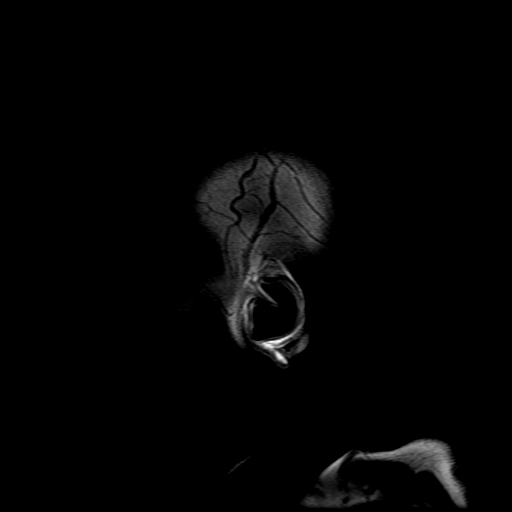

[Series 4: DWI · axial · 3.0mm · 1.09mm/px · z∈[-39,+98]mm · 8 of 96 slices shown (1 of 4)]
[im 1/96]
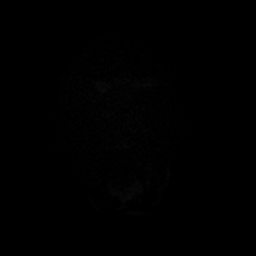
[im 14/96]
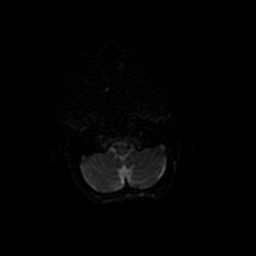
[im 28/96]
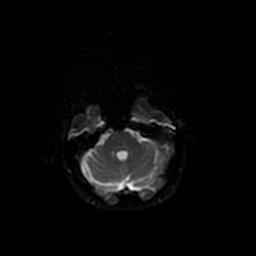
[im 41/96]
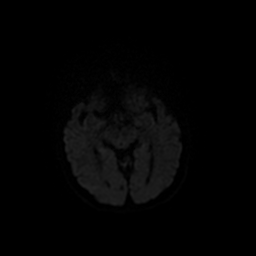
[im 55/96]
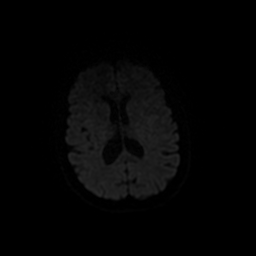
[im 68/96]
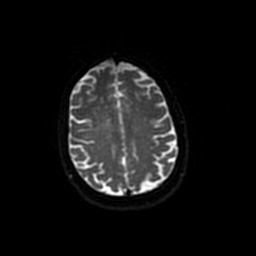
[im 82/96]
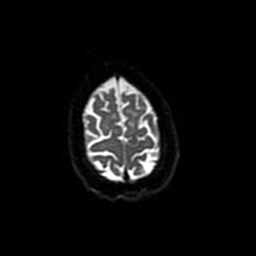
[im 96/96]
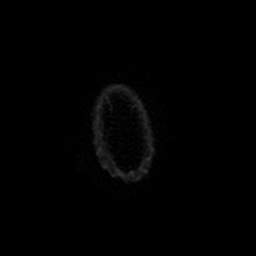

[Series 5: DWI · coronal · 3.0mm · 1.09mm/px · 9 of 114 slices shown (2 of 4)]
[im 1/114]
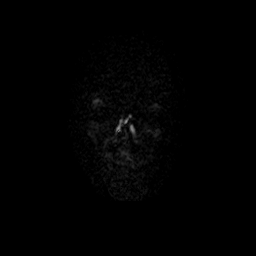
[im 15/114]
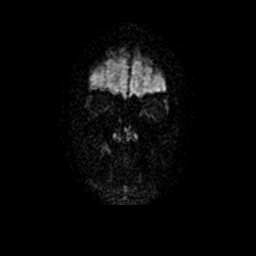
[im 29/114]
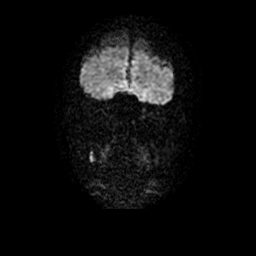
[im 43/114]
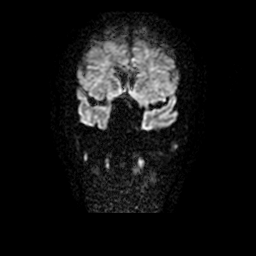
[im 57/114]
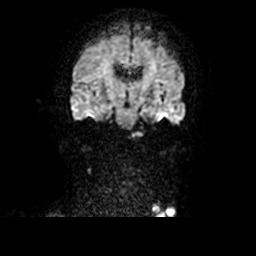
[im 71/114]
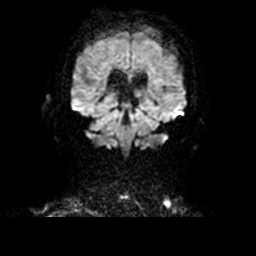
[im 85/114]
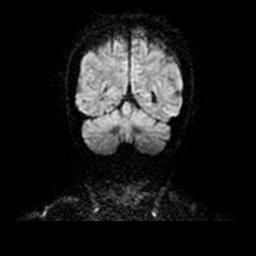
[im 99/114]
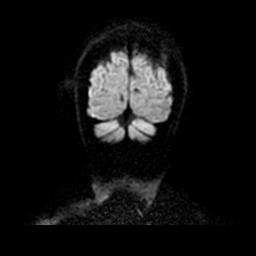
[im 114/114]
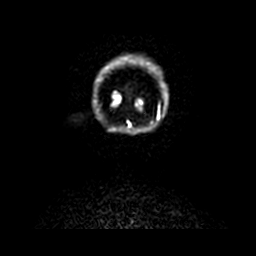

[Series 6: T2 · axial · 5.0mm · 0.43mm/px · z∈[-32,+104]mm · 2 of 21 slices shown]
[im 1/21]
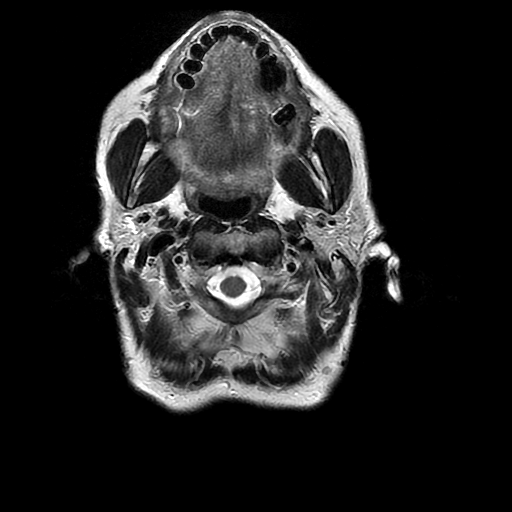
[im 21/21]
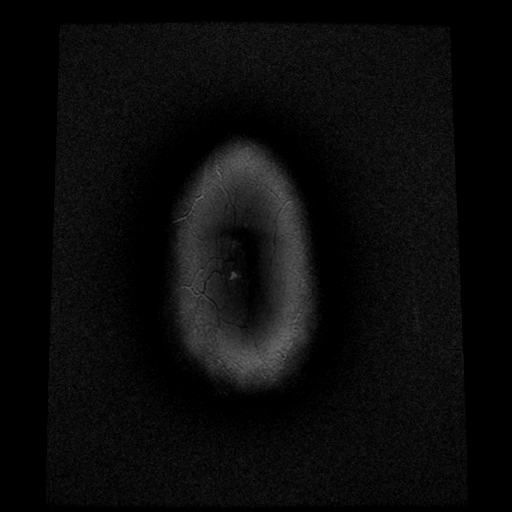

[Series 7: FLAIR · axial · 3.0mm · 0.43mm/px · z∈[-34,+106]mm · 2 of 25 slices shown]
[im 1/25]
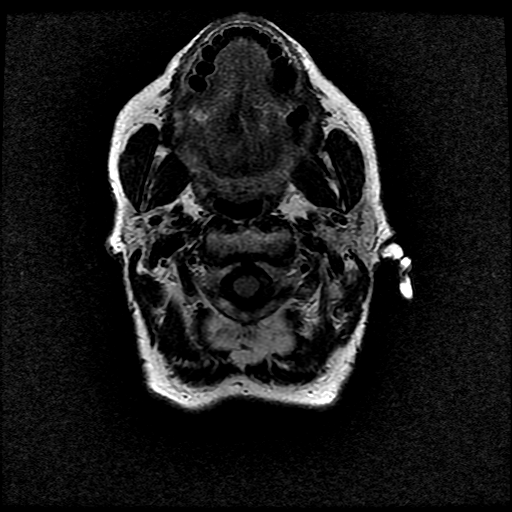
[im 25/25]
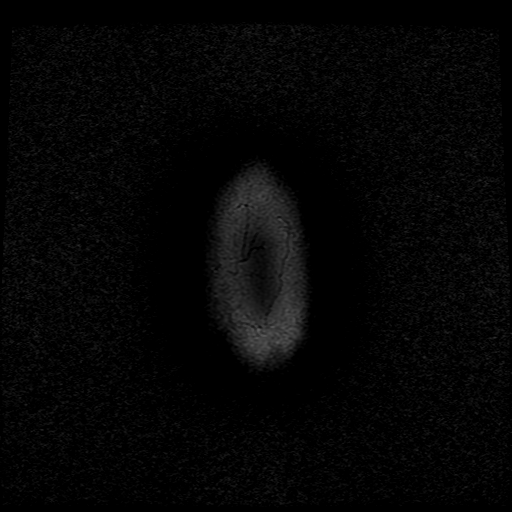

[Series 10: T2 post-contrast · coronal · 5.0mm · 0.45mm/px · 2 of 27 slices shown]
[im 1/27]
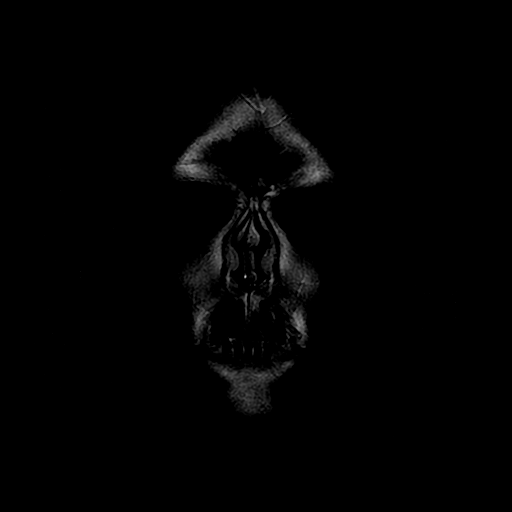
[im 27/27]
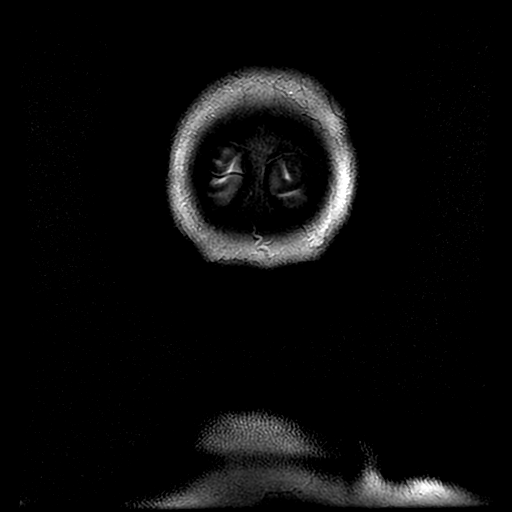

[Series 12: T1 post-contrast · coronal · 5.0mm · 0.45mm/px · 2 of 27 slices shown (1 of 2)]
[im 1/27]
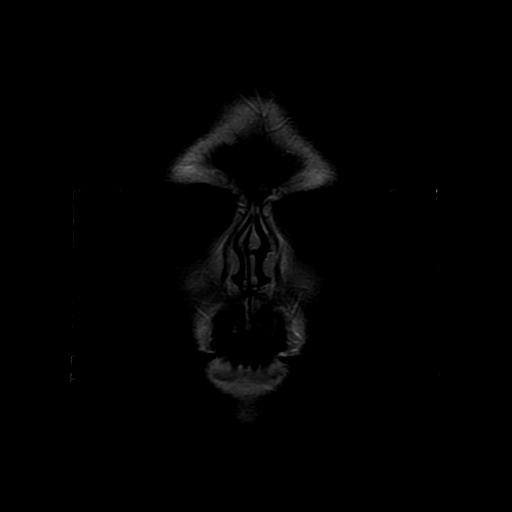
[im 27/27]
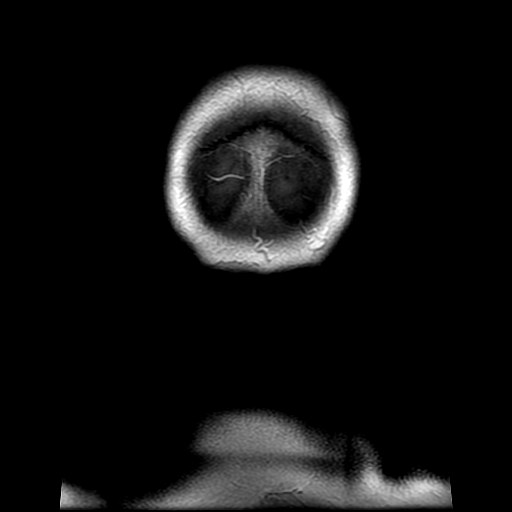

[Series 13: T1 post-contrast · sagittal · 5.0mm · 0.47mm/px · 2 of 24 slices shown (2 of 2)]
[im 1/24]
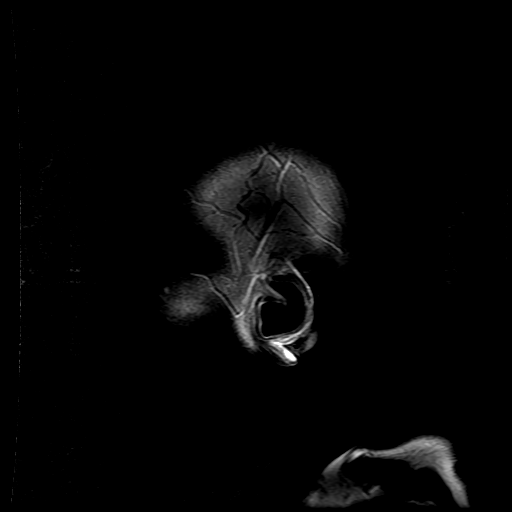
[im 24/24]
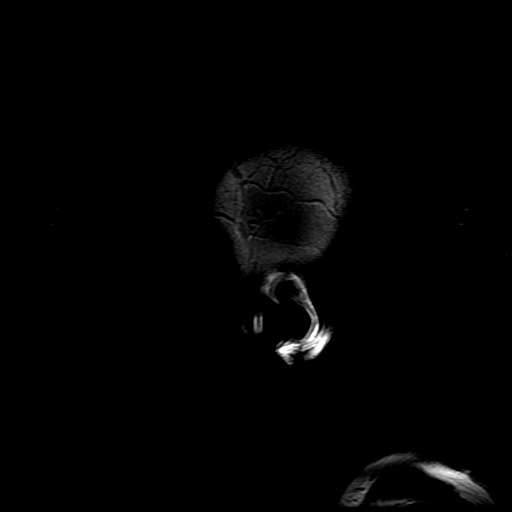

[Series 400: DWI · axial · 3.0mm · 1.09mm/px · z∈[-39,+98]mm · 4 of 48 slices shown (3 of 4)]
[im 1/48]
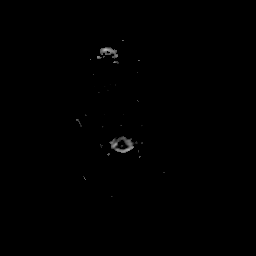
[im 16/48]
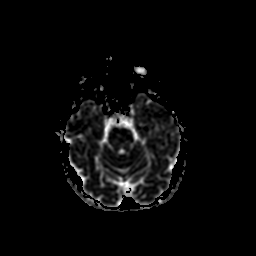
[im 32/48]
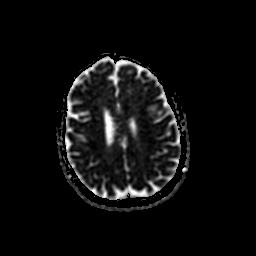
[im 48/48]
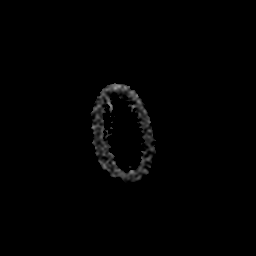

[Series 500: DWI · coronal · 3.0mm · 1.09mm/px · 5 of 57 slices shown (4 of 4)]
[im 1/57]
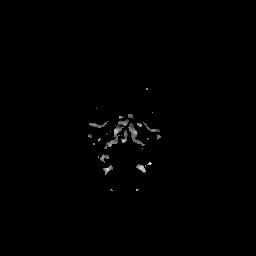
[im 15/57]
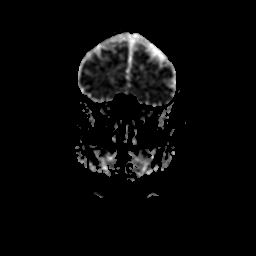
[im 29/57]
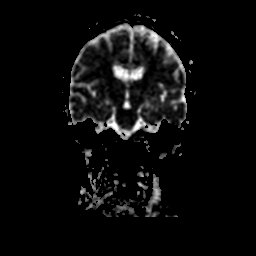
[im 43/57]
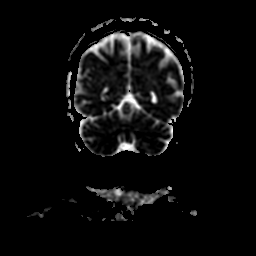
[im 57/57]
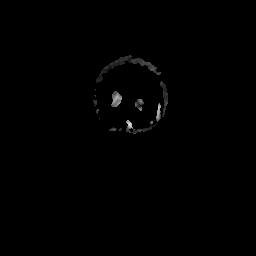

[37 of 48 positions shown; findings below may reference images not displayed]

FINDINGS: Brain: There is no evidence of acute infarct, intracranial
hemorrhage, mass, midline shift, or extra-axial fluid collection.
There is mild cerebral atrophy. A small chronic right cerebellar
infarct is noted. Patchy T2 hyperintensities in the cerebral white
matter bilaterally are nonspecific but compatible with
mild-to-moderate chronic small vessel ischemic disease. No abnormal
enhancement is identified.

Vascular: Major intracranial vascular flow voids are preserved.

Skull and upper cervical spine: No suspicious marrow lesion.

Sinuses/Orbits: Right cataract extraction. Small volume mucous in
the left sphenoid sinus. Clear mastoid air cells.

Other: None.
IMPRESSION: 1. No evidence of intracranial metastases.
2. Mild-to-moderate chronic small vessel ischemic disease.

## 2019-07-07 DEATH — deceased
